# Patient Record
Sex: Male | Born: 1990 | Race: Black or African American | Hispanic: No | Marital: Single | Smoking: Current some day smoker
Health system: Southern US, Community
[De-identification: ages and names within clinical notes are randomized; demographics above are authoritative.]

## PROBLEM LIST (undated history)

## (undated) DIAGNOSIS — K219 Gastro-esophageal reflux disease without esophagitis: Secondary | ICD-10-CM

## (undated) DIAGNOSIS — Z833 Family history of diabetes mellitus: Secondary | ICD-10-CM

## (undated) DIAGNOSIS — F419 Anxiety disorder, unspecified: Secondary | ICD-10-CM

## (undated) DIAGNOSIS — E669 Obesity, unspecified: Secondary | ICD-10-CM

## (undated) DIAGNOSIS — F32A Depression, unspecified: Secondary | ICD-10-CM

## (undated) HISTORY — DX: Obesity, unspecified: E66.9

## (undated) HISTORY — DX: Anxiety disorder, unspecified: F41.9

## (undated) HISTORY — DX: Family history of diabetes mellitus: Z83.3

## (undated) HISTORY — DX: Depression, unspecified: F32.A

## (undated) HISTORY — DX: Gastro-esophageal reflux disease without esophagitis: K21.9

## (undated) HISTORY — PX: WISDOM TOOTH EXTRACTION: SHX21

---
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
## 2012-01-08 NOTE — ED Provider Notes (Signed)
Medical screening examination/treatment/procedure(s) were performed by non-physician practitioner and as supervising physician I was immediately available for consultation/collaboration.  Jamaia Brum M Taima Rada, MD 01/08/12 1636 

## 2012-01-19 NOTE — ED Provider Notes (Signed)
Medical screening examination/treatment/procedure(s) were performed by non-physician practitioner and as supervising physician I was immediately available for consultation/collaboration.  Hurman Horn, MD 01/19/12 [phone]

## 2013-09-28 IMAGING — CT CT HEAD W/O CM
1 series · 16 of 30 positions shown, 20 images · non-contrast
Comparison: None.

CLINICAL DATA: Arm numbness, headache and dizziness.

CT HEAD WITHOUT CONTRAST
TECHNIQUE: Contiguous axial images were obtained from the base of
the skull through the vertex without contrast

[Series 2: head routine 4.8 h37s · axial · 0.43mm/px · z∈[-46,+86]mm · 16 of 30 slices shown, 20 images]
[im 2/30  brain]
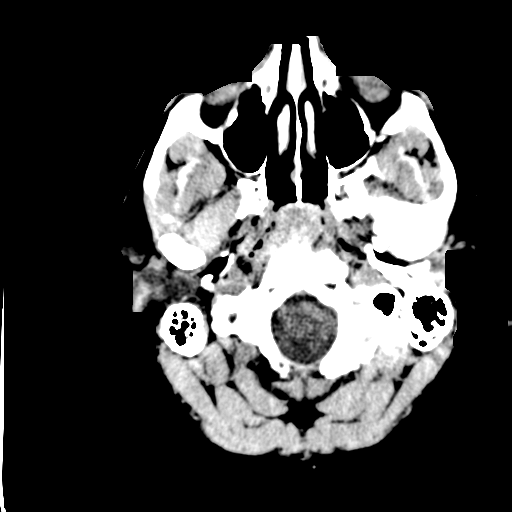
[im 2/30  bone]
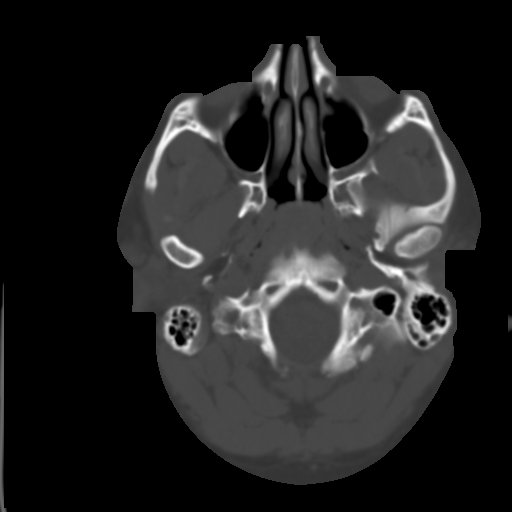
[im 4/30  brain]
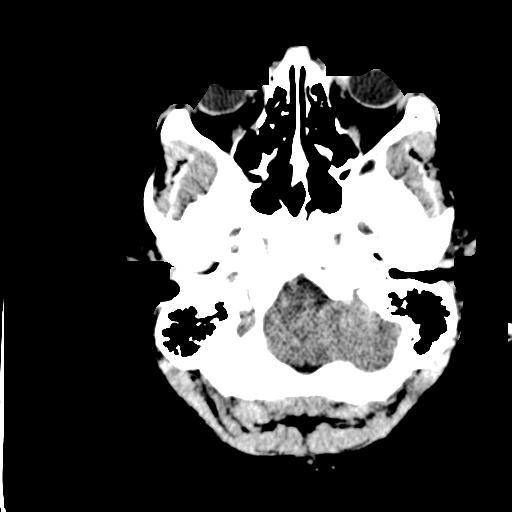
[im 6/30  brain]
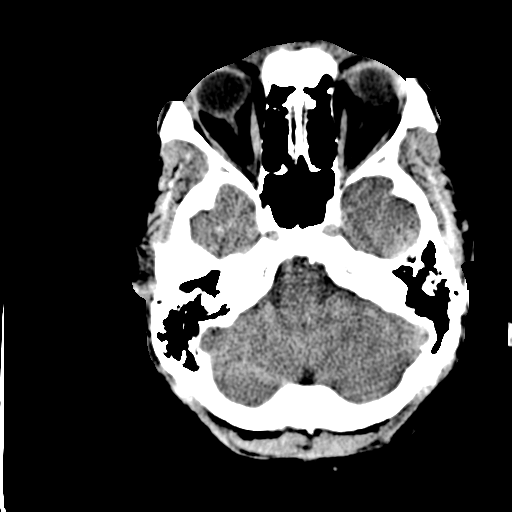
[im 8/30  brain]
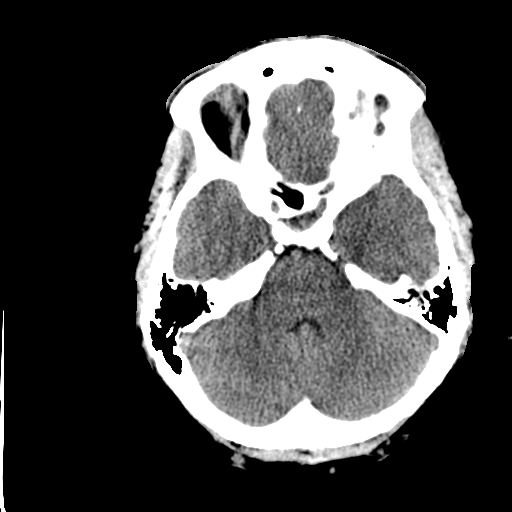
[im 9/30  brain]
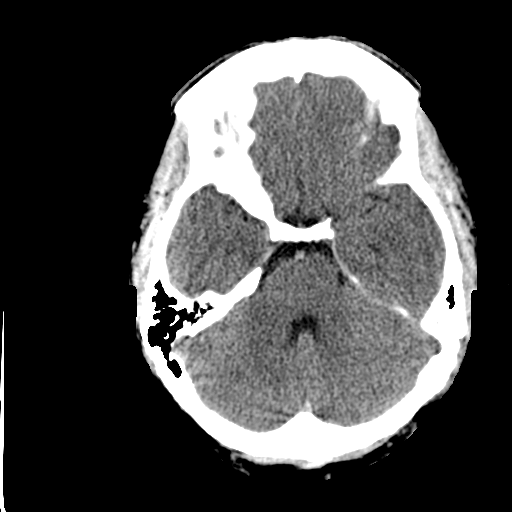
[im 9/30  bone]
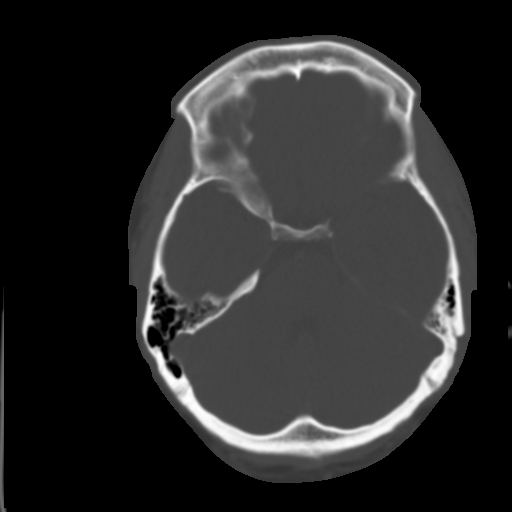
[im 11/30  brain]
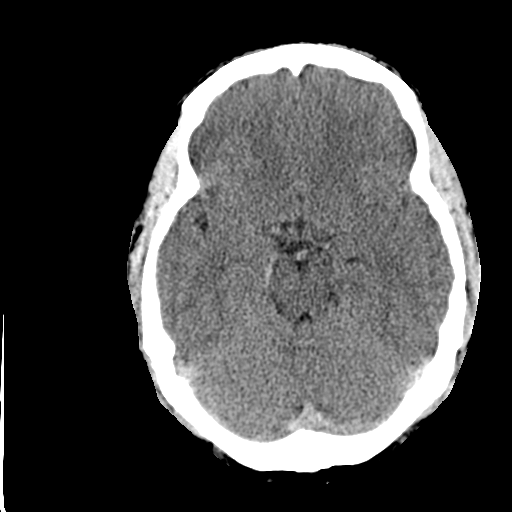
[im 13/30  brain]
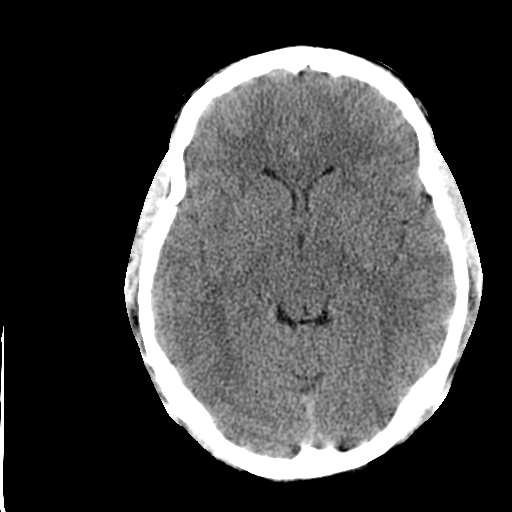
[im 15/30  brain]
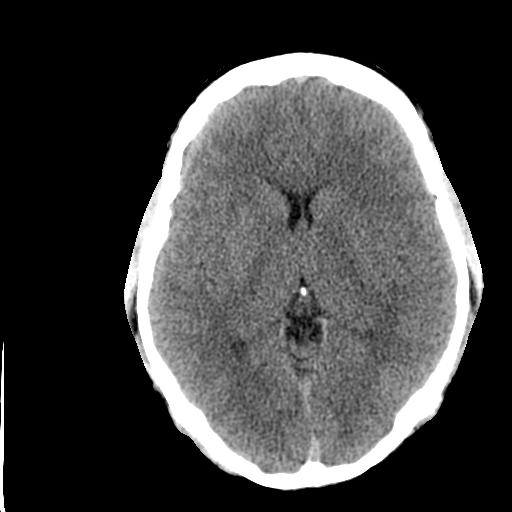
[im 16/30  brain]
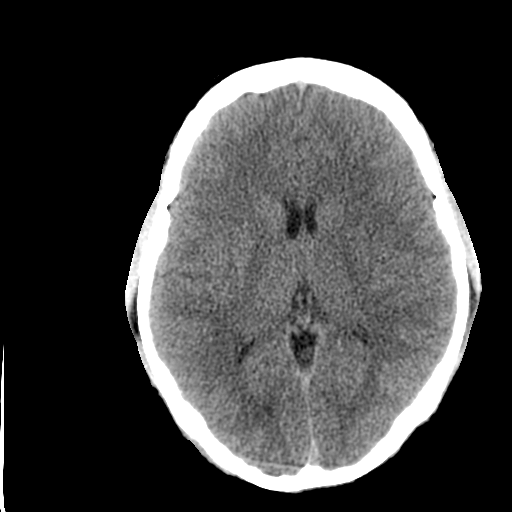
[im 16/30  bone]
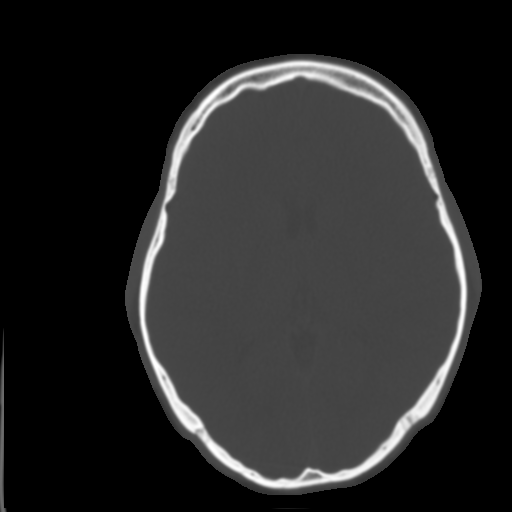
[im 18/30  brain]
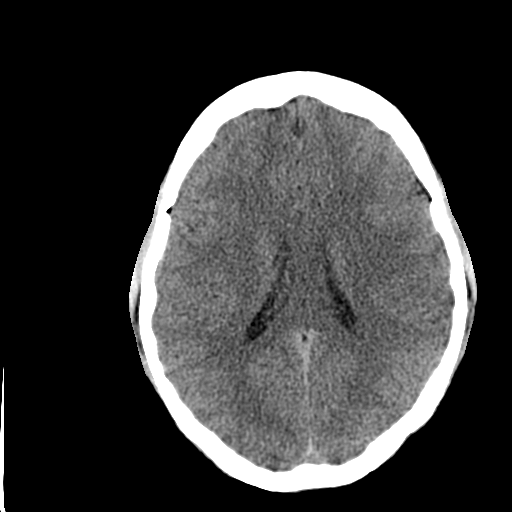
[im 20/30  brain]
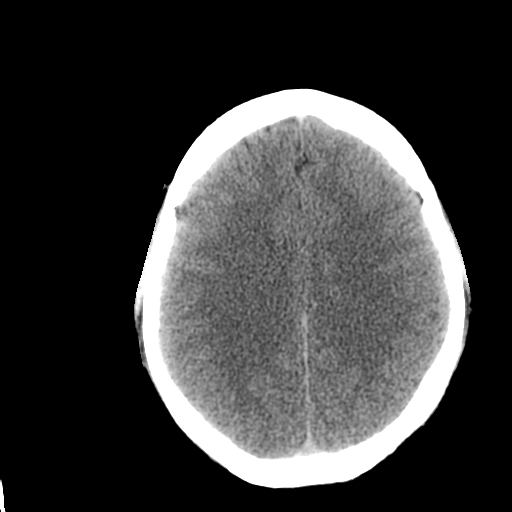
[im 22/30  brain]
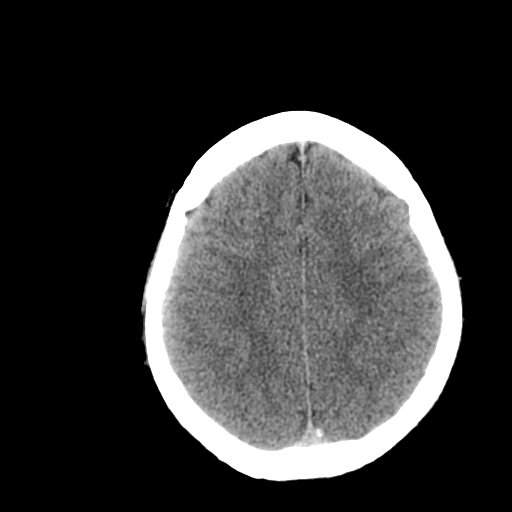
[im 23/30  brain]
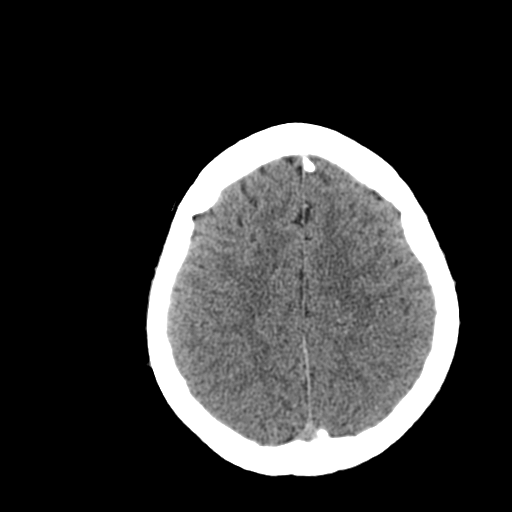
[im 23/30  bone]
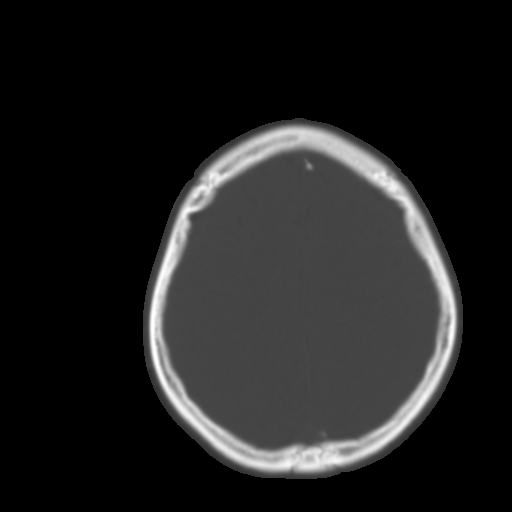
[im 25/30  brain]
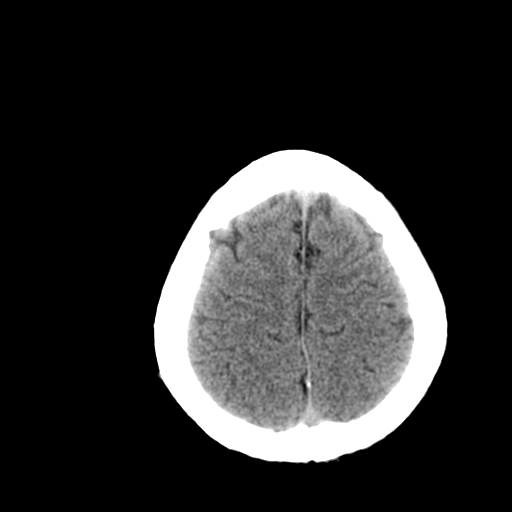
[im 27/30  brain]
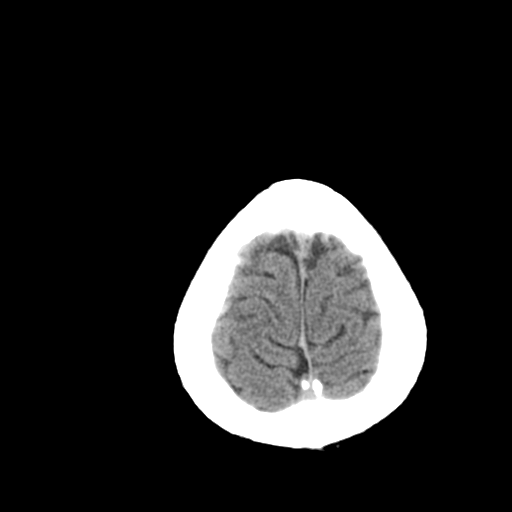
[im 29/30  brain]
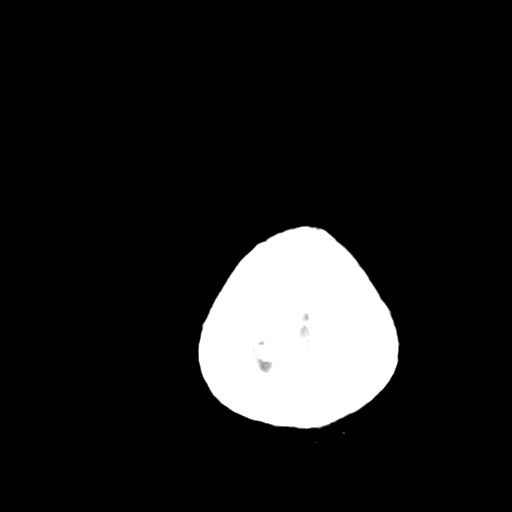

[16 of 30 positions shown; findings below may reference images not displayed]

FINDINGS: The brain has a normal appearance without evidence for
hemorrhage, acute infarction, hydrocephalus, or mass lesion.  There
is no extra axial fluid collection.  The skull and paranasal
sinuses are normal.
IMPRESSION: Normal CT of the head without contrast.

## 2015-05-21 ENCOUNTER — Ambulatory Visit: Payer: Self-pay | Attending: Internal Medicine

## 2015-07-17 ENCOUNTER — Encounter: Payer: Self-pay | Admitting: Family Medicine

## 2015-07-17 ENCOUNTER — Ambulatory Visit (INDEPENDENT_AMBULATORY_CARE_PROVIDER_SITE_OTHER): Payer: No Typology Code available for payment source | Admitting: Family Medicine

## 2015-07-17 DIAGNOSIS — Z6841 Body Mass Index (BMI) 40.0 and over, adult: Secondary | ICD-10-CM | POA: Insufficient documentation

## 2015-07-17 LAB — POCT URINALYSIS DIP (DEVICE)
Bilirubin Urine: NEGATIVE
Glucose, UA: NEGATIVE mg/dL
Hgb urine dipstick: NEGATIVE
Ketones, ur: NEGATIVE mg/dL
Leukocytes, UA: NEGATIVE
Nitrite: NEGATIVE
Protein, ur: NEGATIVE mg/dL
Specific Gravity, Urine: 1.025 (ref 1.005–1.030)
Urobilinogen, UA: 1 mg/dL (ref 0.0–1.0)
pH: 5.5 (ref 5.0–8.0)

## 2015-07-17 LAB — TSH: TSH: 1.39 m[IU]/L (ref 0.40–4.50)

## 2015-07-17 NOTE — Progress Notes (Signed)
Subjective:    Patient ID: Rishav Rockefeller, male    DOB: 09-14-1990, 25 y.o.   MRN: [id number]  HPI Mr. Obie Kallenbach, a 25 year old male presents to establish care. He states that he has not had a primary provider in over the past several years. Patient states that he feels well and has minimal complaints. He is complaining about weight. He states that he does not follow a balanced diet and does not exercise. He maintains that he has a family history of type 2 diabetes mellitus.  Patient denies foot ulcerations, nausea, paresthesia of the feet, polydipsia, polyuria and visual disturbances.   Past Medical History  Diagnosis Date  . Family history of diabetes mellitus in first degree relative   . Obesity      There is no immunization history on file for this patient.  History reviewed. No pertinent past surgical history.  Social History   Social History  . Marital Status: Single    Spouse Name: N/A  . Number of Children: N/A  . Years of Education: N/A   Occupational History  . Not on file.   Social History Main Topics  . Smoking status: Never Smoker   . Smokeless tobacco: Never Used  . Alcohol Use: No  . Drug Use: No  . Sexual Activity: Not on file   Other Topics Concern  . Not on file   Social History Narrative   Review of Systems  Constitutional: Negative.   HENT: Negative.   Eyes: Negative.   Respiratory: Negative.   Cardiovascular: Negative.   Gastrointestinal: Negative.   Endocrine: Negative.  Negative for polydipsia, polyphagia and polyuria.  Genitourinary: Negative.   Musculoskeletal: Negative.   Skin: Negative.   Allergic/Immunologic: Negative.   Neurological: Negative.   Hematological: Negative.   Psychiatric/Behavioral: Negative.       Objective:   Physical Exam  Constitutional: He is oriented to person, place, and time. He appears well-developed and well-nourished.  HENT:  Head: Normocephalic and atraumatic.  Right Ear: External ear normal.  Left  Ear: External ear normal.  Nose: Nose normal.  Eyes: Conjunctivae and EOM are normal. Pupils are equal, round, and reactive to light.  Neck: Normal range of motion. Neck supple.  Cardiovascular: Normal rate, regular rhythm, normal heart sounds and intact distal pulses.   Pulmonary/Chest: Effort normal and breath sounds normal.  Abdominal: Soft. Bowel sounds are normal.  Musculoskeletal: Normal range of motion.  Neurological: He is alert and oriented to person, place, and time. He has normal reflexes.  Skin: Skin is warm and dry.  Psychiatric: He has a normal mood and affect. His behavior is normal. Judgment and thought content normal.     BP 129/60 mmHg  Pulse 89  Temp(Src) 98.7 F (37.1 C) (Oral)  Resp 16  Ht 6' (1.829 m)  Wt 310 lb (140.615 kg)  BMI 42.03 kg/m2  SpO2 100% Assessment & Plan:  1. Morbid obesity, unspecified obesity type (HCC) BMI is 42, I suspect metabolic syndrome. Will screen for metabolic syndrome.  - Lipid Panel - Hemoglobin A1c - COMPLETE METABOLIC PANEL WITH GFR - TSH  2. BMI 40.0-44.9, adult (HCC) Recommend a lowfat, low carbohydrate diet divided over 5-6 small meals, increase water intake to 6-8 glasses, and 150 minutes per week of cardiovascular exercise.    Routine Health Maintenance:  Refused vaccinations Recommend yearly testicular exam and monthly self testicular examination Recommend monthly dental visit Routine eye examination   RTC: 9 months for obesity and routine  health maintenance  Massie MaroonHollis,Tomma Ehinger M, FNP

## 2015-07-17 NOTE — Patient Instructions (Signed)
BMI for Adults Body mass index (BMI) is a number that is calculated from a person's weight and height. In most adults, the number is used to find how much of an adult's weight is made up of fat. BMI is not as accurate as a direct measure of body fat. HOW IS BMI CALCULATED? BMI is calculated by dividing weight in kilograms by height in meters squared. It can also be calculated by dividing weight in pounds by height in inches squared, then multiplying the resulting number by 703. Charts are available to help you find your BMI quickly and easily without doing this calculation.  HOW IS BMI INTERPRETED? Health care professionals use BMI charts to identify whether an adult is underweight, at a normal weight, or overweight based on the following guidelines:  Underweight: BMI less than 18.5.  Normal weight: BMI between 18.5 and 24.9.  Overweight: BMI between 25 and 29.9.  Obese: BMI of 30 and above. BMI is usually interpreted the same for males and females. Weight includes both fat and muscle, so someone with a muscular build, such as an athlete, may have a BMI that is higher than 24.9. In cases like these, BMI may not accurately depict body fat. To determine if excess body fat is the cause of a BMI of 25 or higher, further assessments may need to be done by a health care provider. WHY IS BMI A USEFUL TOOL? BMI is used to identify a possible weight problem that may be related to a medical problem or may increase the risk for medical problems. BMI can also be used to promote changes to reach a healthy weight.   This information is not intended to replace advice given to you by your health care provider. Make sure you discuss any questions you have with your health care provider.   Document Released: 12/25/2003 Document Revised: 05/05/2014 Document Reviewed: 09/09/2013 Elsevier Interactive Patient Education 2016 [employer]. Exercising to [employer] Exercising can help you to lose weight. In order  to lose weight through exercise, you need to do vigorous-intensity exercise. You can tell that you are exercising with vigorous intensity if you are breathing very hard and fast and cannot hold a conversation while exercising. Moderate-intensity exercise helps to maintain your current weight. You can tell that you are exercising at a moderate level if you have a higher heart rate and faster breathing, but you are still able to hold a conversation. HOW OFTEN SHOULD I EXERCISE? Choose an activity that you enjoy and set realistic goals. Your health care provider can help you to make an activity plan that works for you. Exercise regularly as directed by your health care provider. This may include:  Doing resistance training twice each week, such as:  Push-ups.  Sit-ups.  Lifting weights.  Using resistance bands.  Doing a given intensity of exercise for a given amount of time. Choose from these options:  150 minutes of moderate-intensity exercise every week.  75 minutes of vigorous-intensity exercise every week.  A mix of moderate-intensity and vigorous-intensity exercise every week. Children, pregnant women, people who are out of shape, people who are overweight, and older adults may need to consult a health care provider for individual recommendations. If you have any sort of medical condition, be sure to consult your health care provider before starting a new exercise program. WHAT ARE SOME ACTIVITIES THAT CAN HELP ME TO LOSE WEIGHT?   Walking at a rate of at least 4.5 miles an hour.  Jogging or  running at a rate of 5 miles per hour.  Biking at a rate of at least 10 miles per hour.  Lap swimming.  Roller-skating or in-line skating.  Cross-country skiing.  Vigorous competitive sports, such as football, basketball, and soccer.  Jumping rope.  Aerobic dancing. HOW CAN I BE MORE ACTIVE IN MY DAY-TO-DAY ACTIVITIES?  Use the stairs instead of the elevator.  Take a walk during  your lunch break.  If you drive, park your car farther away from work or school.  If you take public transportation, get off one stop early and walk the rest of the way.  Make all of your phone calls while standing up and walking around.  Get up, stretch, and walk around every 30 minutes throughout the day. WHAT GUIDELINES SHOULD I FOLLOW WHILE EXERCISING?  Do not exercise so much that you hurt yourself, feel dizzy, or get very short of breath.  Consult your health care provider prior to starting a new exercise program.  Wear comfortable clothes and shoes with good support.  Drink plenty of water while you exercise to prevent dehydration or heat stroke. Body water is lost during exercise and must be replaced.  Work out until you breathe faster and your heart beats faster.   This information is not intended to replace advice given to you by your health care provider. Make sure you discuss any questions you have with your health care provider.   Document Released: 05/17/2010 Document Revised: 05/05/2014 Document Reviewed: 09/15/2013 Elsevier Interactive Patient Education 2016 [employer]. Obesity Obesity is defined as having too much total body fat and a body mass index (BMI) of 30 or more. BMI is an estimate of body fat and is calculated from your height and weight. BMI is typically calculated by your health care provider during regular wellness visits. Obesity happens when you consume more calories than you can burn by exercising or performing daily physical tasks. Prolonged obesity can cause major illnesses or emergencies, such as:  Stroke.  Heart disease.  Diabetes.  Cancer.  Arthritis.  High blood pressure (hypertension).  High cholesterol.  Sleep apnea.  Erectile dysfunction.  Infertility problems. CAUSES   Regularly eating unhealthy foods.  Physical inactivity.  Certain disorders, such as an underactive thyroid (hypothyroidism), Cushing's syndrome, and  polycystic ovarian syndrome.  Certain medicines, such as steroids, some depression medicines, and antipsychotics.  Genetics.  Lack of sleep. DIAGNOSIS A health care provider can diagnose obesity after calculating your BMI. Obesity will be diagnosed if your BMI is 30 or higher. There are other methods of measuring obesity levels. Some other methods include measuring your skinfold thickness, your waist circumference, and comparing your hip circumference to your waist circumference. TREATMENT  A healthy treatment program includes some or all of the following:  Long-term dietary changes.  Exercise and physical activity.  Behavioral and lifestyle changes.  Medicine only under the supervision of your health care provider. Medicines may help, but only if they are used with diet and exercise programs. If your BMI is 40 or higher, your health care provider may recommend specialized surgery or programs to help with weight loss. An unhealthy treatment program includes:  Fasting.  Fad diets.  Supplements and drugs. These choices do not succeed in long-term weight control. HOME CARE INSTRUCTIONS  Exercise and perform physical activity as directed by your health care provider. To increase physical activity, try the following:  Use stairs instead of elevators.  Park farther away from store entrances.  Garden, bike, or  walk instead of watching television or using the computer.  Eat healthy, low-calorie foods and drinks on a regular basis. Eat more fruits and vegetables. Use low-calorie cookbooks or take healthy cooking classes.  Limit fast food, sweets, and processed snack foods.  Eat smaller portions.  Keep a daily journal of everything you eat. There are many free websites to help you with this. It may be helpful to measure your foods so you can determine if you are eating the correct portion sizes.  Avoid drinking alcohol. Drink more water and drinks without calories.  Take  vitamins and supplements only as recommended by your health care provider.  Weight-loss support groups, Government social research officer, counselors, and stress reduction education can also be very helpful. SEEK IMMEDIATE MEDICAL CARE IF:  You have chest pain or tightness.  You have trouble breathing or feel short of breath.  You have weakness or leg numbness.  You feel confused or have trouble talking.  You have sudden changes in your vision.   This information is not intended to replace advice given to you by your health care provider. Make sure you discuss any questions you have with your health care provider.   Document Released: 05/22/2004 Document Revised: 05/05/2014 Document Reviewed: 05/21/2011 Elsevier Interactive Patient Education 2016 Elsevier Inc. Diet for Metabolic Syndrome Metabolic syndrome is a disorder that includes at least three of these conditions:  Abdominal obesity.  Too much sugar in your blood.  High blood pressure.  Higher than normal amount of fat (lipids) in your blood.  Lower than normal level of "good" cholesterol (HDL). Following a healthy diet can help to keep metabolic syndrome under control. It can also help to prevent the development of conditions that are associated with metabolic syndrome, such as diabetes, heart disease, and stroke. Along with exercise, a healthy diet:  Helps to improve the way that the body uses insulin.  Promotes weight loss. A common goal for people with this condition is to lose at least 7 to 10 percent of their starting weight. WHAT DO I NEED TO KNOW ABOUT THIS DIET?  Use the glycemic index (GI) to plan your meals. The index tells you how quickly a food will raise your blood sugar. Choose foods that have low GI values. These foods take a longer time to raise blood sugar.  Keep track of how many calories you take in. Eating the right amount of calories will help your achieve a healthy weight.  You may want to follow a  Mediterranean diet. This diet includes lots of vegetables, lean meats or fish, whole grains, fruits, and healthy oils and fats. WHAT FOODS CAN I EAT? Grains Stone-ground whole wheat. Pumpernickel bread. Whole-grain bread, crackers, tortillas, cereal, and pasta. Unsweetened oatmeal.Bulgur.Barley.Quinoa.Brown rice or wild rice. Vegetables Lettuce. Spinach. Peas. Beets. Cauliflower. Cabbage. Broccoli. Carrots. Tomatoes. Squash. Eggplant. Herbs. Peppers. Onions. Cucumbers. Brussels sprouts. Sweet potatoes. Yams. Beans. Lentils. Fruits Berries. Apples. Oranges. Grapes. Mango. Pomegranate. Kiwi. Cherries. Meats and Other Protein Sources Seafood and shellfish. Lean meats.Poultry. Tofu. Dairy Low-fat or fat-free dairy products, such as milk, yogurt, and cheese. Beverages Water. Low-fat milk. Milk alternatives, like soy milk or almond milk. Real fruit juice. Condiments Low-sugar or sugar-free ketchup, barbecue sauce, and mayonnaise. Mustard. Relish. Fats and Oils Avocado. Canola or olive oil. Nuts and nut butters.Seeds. The items listed above may not be a complete list of recommended foods or beverages. Contact your dietitian for more options.  WHAT FOODS ARE NOT RECOMMENDED? Red meat. Palm oil and coconut oil. Processed  foods. [place] foods. Alcohol. Sweetened drinks, such as iced tea and soda. Sweets. Salty foods. The items listed above may not be a complete list of foods and beverages to avoid. Contact your dietitian for more information.   This information is not intended to replace advice given to you by your health care provider. Make sure you discuss any questions you have with your health care provider.   Document Released: 08/29/2014 Document Reviewed: 08/29/2014 Elsevier Interactive Patient Education [employer]2016 Elsevier Inc.

## 2015-07-18 ENCOUNTER — Telehealth: Payer: Self-pay

## 2015-07-18 LAB — COMPLETE METABOLIC PANEL WITH GFR
ALBUMIN: 3.9 g/dL (ref 3.6–5.1)
ALK PHOS: 56 U/L (ref 40–115)
ALT: 12 U/L (ref 9–46)
AST: 16 U/L (ref 10–40)
BILIRUBIN TOTAL: 0.6 mg/dL (ref 0.2–1.2)
BUN: 14 mg/dL (ref 7–25)
CALCIUM: 9 mg/dL (ref 8.6–10.3)
CO2: 25 mmol/L (ref 20–31)
CREATININE: 0.84 mg/dL (ref 0.60–1.35)
Chloride: 97 mmol/L — ABNORMAL LOW (ref 98–110)
GFR, Est African American: >89 mL/min (ref >=60)
GFR, Est Non African American: >89 mL/min (ref >=60)
Glucose, Bld: 105 mg/dL — ABNORMAL HIGH (ref 65–99)
POTASSIUM: 4.3 mmol/L (ref 3.5–5.3)
Sodium: 137 mmol/L (ref 135–146)
Total Protein: 7.3 g/dL (ref 6.1–8.1)

## 2015-07-18 LAB — LIPID PANEL
Cholesterol: 195 mg/dL (ref 125–200)
HDL: 49 mg/dL (ref >=40)
LDL CALC: 128 mg/dL (ref <130)
TRIGLYCERIDES: 91 mg/dL (ref <150)
Total CHOL/HDL Ratio: 4 Ratio (ref <=5.0)
VLDL: 18 mg/dL (ref <30)

## 2015-07-18 LAB — HEMOGLOBIN A1C
Hgb A1c MFr Bld: 5.9 % — ABNORMAL HIGH (ref <5.7)
Mean Plasma Glucose: 123 mg/dL — ABNORMAL HIGH (ref <117)

## 2015-07-18 NOTE — Telephone Encounter (Signed)
Tried to call patient, number is not working. I will mail letter with information. Thanks!

## 2015-07-18 NOTE — Telephone Encounter (Signed)
-----   Message from Massie MaroonLachina M Hollis, [place] sent at 07/18/2015  7:54 AM EDT ----- Regarding: lab results Please inform patient that hemoglobin a1C is 5.9, which is consistent with prediabetes. Recommend a lowfat, low carbohydrate diet divided over 5-6 small meals, increase water intake to 6-8 glasses, and 150 minutes per week of cardiovascular exercise. Will follow up as scheduled for a re-check of hemoglobin a1C. All other labs are within a normal range.   Thanks   ----- Message -----    From: [facility]    Sent: 07/18/2015   1:58 AM      To: Massie MaroonLachina M Hollis, FNP

## 2016-04-14 ENCOUNTER — Ambulatory Visit: Payer: No Typology Code available for payment source | Attending: Internal Medicine

## 2016-04-17 ENCOUNTER — Ambulatory Visit: Payer: No Typology Code available for payment source | Admitting: Family Medicine

## 2016-05-05 ENCOUNTER — Ambulatory Visit: Payer: No Typology Code available for payment source | Attending: Internal Medicine

## 2016-12-10 ENCOUNTER — Ambulatory Visit: Payer: No Typology Code available for payment source | Attending: Internal Medicine

## 2017-04-06 ENCOUNTER — Ambulatory Visit: Payer: Self-pay

## 2017-05-06 ENCOUNTER — Ambulatory Visit: Payer: Self-pay | Attending: Internal Medicine

## 2019-09-27 ENCOUNTER — Other Ambulatory Visit: Payer: Self-pay

## 2019-09-27 ENCOUNTER — Encounter: Payer: Self-pay | Admitting: Family Medicine

## 2019-09-27 ENCOUNTER — Ambulatory Visit (INDEPENDENT_AMBULATORY_CARE_PROVIDER_SITE_OTHER): Payer: 59 | Admitting: Family Medicine

## 2019-09-27 VITALS — BP 116/60 | HR 72 | Temp 98.4°F | Ht 72.0 in | Wt 366.0 lb

## 2019-09-27 DIAGNOSIS — F3289 Other specified depressive episodes: Secondary | ICD-10-CM | POA: Diagnosis not present

## 2019-09-27 DIAGNOSIS — F411 Generalized anxiety disorder: Secondary | ICD-10-CM | POA: Diagnosis not present

## 2019-09-27 DIAGNOSIS — E889 Metabolic disorder, unspecified: Secondary | ICD-10-CM

## 2019-09-27 DIAGNOSIS — Z23 Encounter for immunization: Secondary | ICD-10-CM

## 2019-09-27 LAB — POCT URINALYSIS DIPSTICK
Bilirubin, UA: NEGATIVE
Blood, UA: NEGATIVE
Glucose, UA: NEGATIVE
Ketones, UA: NEGATIVE
Leukocytes, UA: NEGATIVE
Nitrite, UA: NEGATIVE
Protein, UA: POSITIVE — AB
Spec Grav, UA: >=1.03 — AB (ref 1.010–1.025)
Urobilinogen, UA: 0.2 E.U./dL
pH, UA: 6 (ref 5.0–8.0)

## 2019-09-27 LAB — POCT GLYCOSYLATED HEMOGLOBIN (HGB A1C)
HbA1c POC (<> result, manual entry): 5.9 % (ref 4.0–5.6)
HbA1c, POC (controlled diabetic range): 5.9 % (ref 0.0–7.0)
HbA1c, POC (prediabetic range): 5.9 % (ref 5.7–6.4)
Hemoglobin A1C: 5.9 % — AB (ref 4.0–5.6)

## 2019-09-27 MED ORDER — SERTRALINE HCL 25 MG PO TABS: 25.0000 mg | ORAL_TABLET | Freq: Every day | ORAL | 1 refills | Status: DC

## 2019-09-27 NOTE — Patient Instructions (Signed)
Sertraline tablets What is this medicine? SERTRALINE (SER tra leen) is used to treat depression. It may also be used to treat obsessive compulsive disorder, panic disorder, post-trauma stress, premenstrual dysphoric disorder (PMDD) or social anxiety. This medicine may be used for other purposes; ask your health care provider or pharmacist if you have questions. COMMON BRAND NAME(S): Zoloft What should I tell my health care provider before I take this medicine? They need to know if you have any of these conditions:  bleeding disorders  bipolar disorder or a family history of bipolar disorder  glaucoma  heart disease  high blood pressure  history of irregular heartbeat  history of low levels of calcium, magnesium, or potassium in the blood  if you often drink alcohol  liver disease  receiving electroconvulsive therapy  seizures  suicidal thoughts, plans, or attempt; a previous suicide attempt by you or a family member  take medicines that treat or prevent blood clots  thyroid disease  an unusual or allergic reaction to sertraline, other medicines, foods, dyes, or preservatives  pregnant or trying to get pregnant  breast-feeding How should I use this medicine? Take this medicine by mouth with a glass of water. Follow the directions on the prescription label. You can take it with or without food. Take your medicine at regular intervals. Do not take your medicine more often than directed. Do not stop taking this medicine suddenly except upon the advice of your doctor. Stopping this medicine too quickly may cause serious side effects or your condition may worsen. A special MedGuide will be given to you by the pharmacist with each prescription and refill. Be sure to read this information carefully each time. Talk to your pediatrician regarding the use of this medicine in children. While this drug may be prescribed for children as young as 7 years for selected conditions,  precautions do apply. Overdosage: If you think you have taken too much of this medicine contact a poison control center or emergency room at once. NOTE: This medicine is only for you. Do not share this medicine with others. What if I miss a dose? If you miss a dose, take it as soon as you can. If it is almost time for your next dose, take only that dose. Do not take double or extra doses. What may interact with this medicine? Do not take this medicine with any of the following medications:  cisapride  dronedarone  linezolid  MAOIs like Carbex, Eldepryl, Marplan, Nardil, and Parnate  methylene blue (injected into a vein)  pimozide  thioridazine This medicine may also interact with the following medications:  alcohol  amphetamines  aspirin and aspirin-like medicines  certain medicines for depression, anxiety, or psychotic disturbances  certain medicines for fungal infections like ketoconazole, fluconazole, posaconazole, and itraconazole  certain medicines for irregular heart beat like flecainide, quinidine, propafenone  certain medicines for migraine headaches like almotriptan, eletriptan, frovatriptan, naratriptan, rizatriptan, sumatriptan, zolmitriptan  certain medicines for sleep  certain medicines for seizures like carbamazepine, valproic acid, phenytoin  certain medicines that treat or prevent blood clots like warfarin, enoxaparin, dalteparin  cimetidine  digoxin  diuretics  fentanyl  isoniazid  lithium  NSAIDs, medicines for pain and inflammation, like ibuprofen or naproxen  other medicines that prolong the QT interval (cause an abnormal heart rhythm) like dofetilide  rasagiline  safinamide  supplements like St. John's wort, kava kava, valerian  tolbutamide  tramadol  tryptophan This list may not describe all possible interactions. Give your health care provider   a list of all the medicines, herbs, non-prescription drugs, or dietary supplements  you use. Also tell them if you smoke, drink alcohol, or use illegal drugs. Some items may interact with your medicine. What should I watch for while using this medicine? Tell your doctor if your symptoms do not get better or if they get worse. Visit your doctor or health care professional for regular checks on your progress. Because it may take several weeks to see the full effects of this medicine, it is important to continue your treatment as prescribed by your doctor. Patients and their families should watch out for new or worsening thoughts of suicide or depression. Also watch out for sudden changes in feelings such as feeling anxious, agitated, panicky, irritable, hostile, aggressive, impulsive, severely restless, overly excited and hyperactive, or not being able to sleep. If this happens, especially at the beginning of treatment or after a change in dose, call your health care professional. You may get drowsy or dizzy. Do not drive, use machinery, or do anything that needs mental alertness until you know how this medicine affects you. Do not stand or sit up quickly, especially if you are an older patient. This reduces the risk of dizzy or fainting spells. Alcohol may interfere with the effect of this medicine. Avoid alcoholic drinks. Your mouth may get dry. Chewing sugarless gum or sucking hard candy, and drinking plenty of water may help. Contact your doctor if the problem does not go away or is severe. What side effects may I notice from receiving this medicine? Side effects that you should report to your doctor or health care professional as soon as possible:  allergic reactions like skin rash, itching or hives, swelling of the face, lips, or tongue  anxious  black, tarry stools  changes in vision  confusion  elevated mood, decreased need for sleep, racing thoughts, impulsive behavior  eye pain  fast, irregular heartbeat  feeling faint or lightheaded, falls  feeling agitated,  angry, or irritable  hallucination, loss of contact with reality  loss of balance or coordination  loss of memory  painful or prolonged erections  restlessness, pacing, inability to keep still  seizures  stiff muscles  suicidal thoughts or other mood changes  trouble sleeping  unusual bleeding or bruising  unusually weak or tired  vomiting Side effects that usually do not require medical attention (report to your doctor or health care professional if they continue or are bothersome):  change in appetite or weight  change in sex drive or performance  diarrhea  increased sweating  indigestion, nausea  tremors This list may not describe all possible side effects. Call your doctor for medical advice about side effects. You may report side effects to FDA at 1-800-FDA-1088. Where should I keep my medicine? Keep out of the reach of children. Store at room temperature between 15 and 30 degrees C (59 and 86 degrees F). Throw away any unused medicine after the expiration date. NOTE: This sheet is a summary. It may not cover all possible information. If you have questions about this medicine, talk to your doctor, pharmacist, or health care provider.  2020 Elsevier/Gold Standard (2018-04-06 10:09:27)  

## 2019-09-27 NOTE — Progress Notes (Signed)
Patient [facility]   New Patient Office Visit  Subjective:  Patient ID: Antonio Hall, male    DOB: [birth year]  Age: 29 y.o. MRN: [id number]  CC:  Chief Complaint  Patient presents with  . Annual Exam    discuss referral to neurology and to mental health     HPI Gasper Hopes is a 29 year old male with a medical history sinificant for morbid obesity presents to reestablish care with a primary care provider. Patient says that he has been experiencing some anxiety and depression over the past several months. Patient has been dealing with the death of his mother. Also, he says that he has been experiencing some financial constraints.  Depression        This is a new problem.  The onset quality is gradual.   The problem occurs daily.  The problem has been gradually worsening since onset.  Associated symptoms include decreased concentration, fatigue, helplessness, hopelessness, restlessness and decreased interest.  Associated symptoms include does not have insomnia, no appetite change, no body aches and no suicidal ideas.     The symptoms are aggravated by family issues and social issues (Patient endorses social anxiety).  Past medical history includes anxiety.   Anxiety Presents for initial visit. Onset was 6 to 12 months ago. The problem has been gradually worsening. Symptoms include decreased concentration, nervous/anxious behavior and restlessness. Patient reports no insomnia or suicidal ideas.     Past Medical History:  Diagnosis Date  . Family history of diabetes mellitus in first degree relative   . Obesity     No past surgical history on file.  Family History  Problem Relation Age of Onset  . Hypertension Mother   . Diabetes Paternal Grandmother     Social History   Socioeconomic History  . Marital status: Single    Spouse name: Not on file  . Number of children: Not on file  . Years of education: Not on file  . Highest  education level: Not on file  Occupational History  . Not on file  Tobacco Use  . Smoking status: Never Smoker  . Smokeless tobacco: Never Used  Substance and Sexual Activity  . Alcohol use: No  . Drug use: No  . Sexual activity: Not on file  Other Topics Concern  . Not on file  Social History Narrative  . Not on file   Social Determinants of Health   Financial Resource Strain:   . Difficulty of Paying Living Expenses:   Food Insecurity:   . Worried About Charity fundraiser in the Last Year:   . Arboriculturist in the Last Year:   Transportation Needs:   . Film/video editor (Medical):   Marland Kitchen Lack of Transportation (Non-Medical):   Physical Activity:   . Days of Exercise per Week:   . Minutes of Exercise per Session:   Stress:   . Feeling of Stress :   Social Connections:   . Frequency of Communication with Friends and Family:   . Frequency of Social Gatherings with Friends and Family:   . Attends Religious Services:   . Active Member of Clubs or Organizations:   . Attends Archivist Meetings:   Marland Kitchen Marital Status:   Intimate Partner Violence:   . Fear of Current or Ex-Partner:   . Emotionally Abused:   Marland Kitchen Physically Abused:   . Sexually Abused:   Marland Kitchen  ROS Review of Systems  Constitutional: Positive for fatigue. Negative for appetite change.  HENT: Negative.   Eyes: Negative.   Respiratory: Negative.   Cardiovascular: Negative.   Gastrointestinal: Negative.   Endocrine: Negative.   Genitourinary: Negative.   Musculoskeletal: Negative.   Skin: Negative.   Neurological: Negative.   Psychiatric/Behavioral: Positive for decreased concentration and depression. Negative for suicidal ideas. The patient is nervous/anxious. The patient does not have insomnia.     Objective:   Today's Vitals: BP 116/60 (BP Location: Left Arm, Patient Position: Sitting)   Pulse 72   Temp 98.4 F (36.9 C)   Ht 6' (1.829 m)   Wt (!) 366 lb (166 kg)   SpO2 100%   BMI  49.64 kg/m   Physical Exam Constitutional:      Appearance: Normal appearance. He is obese.  HENT:     Mouth/Throat:     Mouth: Mucous membranes are moist.  Cardiovascular:     Rate and Rhythm: Normal rate and regular rhythm.     Pulses: Normal pulses.  Pulmonary:     Effort: Pulmonary effort is normal.  Abdominal:     General: Abdomen is flat. Bowel sounds are normal.  Musculoskeletal:     Cervical back: Normal range of motion.  Skin:    General: Skin is warm.  Neurological:     General: No focal deficit present.     Mental Status: He is alert and oriented to person, place, and time. Mental status is at baseline.  Psychiatric:        Mood and Affect: Mood is anxious.        Behavior: Behavior is cooperative.        Thought Content: Thought content does not include homicidal or suicidal ideation.     Assessment & Plan:   Problem List Items Addressed This Visit    None      Outpatient Encounter Medications as of 09/27/2019  Medication Sig  . cholecalciferol (VITAMIN D3) 25 MCG (1000 UNIT) tablet Take 1,000 Units by mouth daily. Once a day 1 tablet  . Cobalamin Combinations (NEURIVA PLUS PO) Take by mouth. 1 tablet once a day  . vitamin B-12 (CYANOCOBALAMIN) 100 MCG tablet Take 100 mcg by mouth daily. 1 tablet once a day   No facility-administered encounter medications on file as of 09/27/2019.   1. Anxiety, generalized GAD 7 : Generalized Anxiety Score 09/27/2019  Nervous, Anxious, on Edge 3  Control/stop worrying 2  Worry too much - different things 2  Trouble relaxing 0  Restless 0  Easily annoyed or irritable 0  Afraid - awful might happen 0  Total GAD 7 Score 7  Anxiety Difficulty Not difficult at all    Will start a trial of sertraline 25 mg at bedtime.  - Ambulatory referral to Psychiatry - sertraline (ZOLOFT) 25 MG tablet; Take 1 tablet (25 mg total) by mouth at bedtime.  Dispense: 30 tablet; Refill: 1  2. Other depression   Office Visit from 09/27/2019 in  [facility]  PHQ-2 Total Score 4     - Ambulatory referral to Psychiatry - sertraline (ZOLOFT) 25 MG tablet; Take 1 tablet (25 mg total) by mouth at bedtime.  Dispense: 30 tablet; Refill: 1  3. Metabolic disorder The patient is asked to make an attempt to improve diet and exercise patterns to aid in medical management of this problem. - HgB A1c - TSH - Comprehensive metabolic panel - Lipid Panel - POCT Urinalysis Dipstick  4. Need for  Tdap vaccination - Tdap vaccine greater than or equal to 7yo IM  5. Class 2 severe obesity due to excess calories with serious comorbidity in adult, unspecified BMI (HCC) Last Weight  Most recent update: 09/27/2019 10:34 AM   Weight  166 kg (366 lb)             - HgB A1c - Comprehensive metabolic panel Follow-up: Return in about 1 month (around 10/27/2019).   Nolon Nations  APRN, MSN, FNP-C Patient Care [facility] [address] [place], [place] [address] [phone]

## 2019-09-28 LAB — COMPREHENSIVE METABOLIC PANEL
ALT: 14 IU/L (ref 0–44)
AST: 19 IU/L (ref 0–40)
Albumin/Globulin Ratio: 1.4 (ref 1.2–2.2)
Albumin: 4.3 g/dL (ref 4.1–5.2)
Alkaline Phosphatase: 64 IU/L (ref 48–121)
BUN/Creatinine Ratio: 23 — ABNORMAL HIGH (ref 9–20)
BUN: 20 mg/dL (ref 6–20)
Bilirubin Total: <0.2 mg/dL (ref 0.0–1.2)
CO2: 23 mmol/L (ref 20–29)
Calcium: 9.4 mg/dL (ref 8.7–10.2)
Chloride: 100 mmol/L (ref 96–106)
Creatinine, Ser: 0.86 mg/dL (ref 0.76–1.27)
GFR calc Af Amer: 138 mL/min/{1.73_m2} (ref >59)
GFR calc non Af Amer: 120 mL/min/{1.73_m2} (ref >59)
Globulin, Total: 3 g/dL (ref 1.5–4.5)
Glucose: 97 mg/dL (ref 65–99)
Potassium: 4.9 mmol/L (ref 3.5–5.2)
Sodium: 138 mmol/L (ref 134–144)
Total Protein: 7.3 g/dL (ref 6.0–8.5)

## 2019-09-28 LAB — TSH: TSH: 1.96 u[IU]/mL (ref 0.450–4.500)

## 2019-09-28 LAB — LIPID PANEL
Chol/HDL Ratio: 4 ratio (ref 0.0–5.0)
Cholesterol, Total: 204 mg/dL — ABNORMAL HIGH (ref 100–199)
HDL: 51 mg/dL (ref >39)
LDL Chol Calc (NIH): 142 mg/dL — ABNORMAL HIGH (ref 0–99)
Triglycerides: 59 mg/dL (ref 0–149)
VLDL Cholesterol Cal: 11 mg/dL (ref 5–40)

## 2019-11-01 ENCOUNTER — Ambulatory Visit (INDEPENDENT_AMBULATORY_CARE_PROVIDER_SITE_OTHER): Payer: 59 | Admitting: Family Medicine

## 2019-11-01 ENCOUNTER — Encounter: Payer: Self-pay | Admitting: Family Medicine

## 2019-11-01 ENCOUNTER — Other Ambulatory Visit: Payer: Self-pay

## 2019-11-01 DIAGNOSIS — F3289 Other specified depressive episodes: Secondary | ICD-10-CM

## 2019-11-01 DIAGNOSIS — F411 Generalized anxiety disorder: Secondary | ICD-10-CM | POA: Diagnosis not present

## 2019-11-01 MED ORDER — SERTRALINE HCL 50 MG PO TABS: 50.0000 mg | ORAL_TABLET | Freq: Every day | ORAL | 2 refills | Status: DC

## 2019-11-01 NOTE — Progress Notes (Signed)
[facility]    Established Patient Office Visit  Subjective:  Patient ID: Antonio Hall, male    DOB: May 18, 1990  Age: 29 y.o. MRN: [id number]  CC:  Chief Complaint  Patient presents with  . Follow-up    requesting increase dose for Zoloft.    HPI Antonio Hall is a 29 year old male with a medical history significant for generalized anxiety, obesity, and prediabetes that presents for follow-up of anxiety.  Patient is also requesting an increase in Zoloft.  Approximately 1 month ago patient was diagnosed with generalized anxiety disorder.  Patient's GAD-7 and medical history were consistent with ongoing anxiety.  Patient was referred to psychiatry at that time, but has not received an appointment.  Patient was started on a trial of Zoloft 25 mg at bedtime.  He states that medication has been mostly effective throughout the day, but seems to wear off in the evenings prior to taking evening dose.  He continues to endorse some excessive worrying.  He denies any suicidal or homicidal intent.  He also denies any visual or auditory hallucinations.  Patient is concerned that he may have a "problem with his brain", he is requesting "brain x-rays".  Past Medical History:  Diagnosis Date  . Family history of diabetes mellitus in first degree relative   . Obesity     No past surgical history on file.  Family History  Problem Relation Age of Onset  . Hypertension Mother   . Diabetes Paternal Grandmother     Social History   Socioeconomic History  . Marital status: Single    Spouse name: Not on file  . Number of children: Not on file  . Years of education: Not on file  . Highest education level: Not on file  Occupational History  . Not on file  Tobacco Use  . Smoking status: Never Smoker  . Smokeless tobacco: Never Used  Substance and Sexual Activity  . Alcohol use: Yes    Comment: occ  . Drug use: No  . Sexual activity: Not on file    Other Topics Concern  . Not on file  Social History Narrative  . Not on file   Social Determinants of Health   Financial Resource Strain:   . Difficulty of Paying Living Expenses:   Food Insecurity:   . Worried About Programme researcher, broadcasting/film/video in the Last Year:   . Barista in the Last Year:   Transportation Needs:   . Freight forwarder (Medical):   Marland Kitchen Lack of Transportation (Non-Medical):   Physical Activity:   . Days of Exercise per Week:   . Minutes of Exercise per Session:   Stress:   . Feeling of Stress :   Social Connections:   . Frequency of Communication with Friends and Family:   . Frequency of Social Gatherings with Friends and Family:   . Attends Religious Services:   . Active Member of Clubs or Organizations:   . Attends Banker Meetings:   Marland Kitchen Marital Status:   Intimate Partner Violence:   . Fear of Current or Ex-Partner:   . Emotionally Abused:   Marland Kitchen Physically Abused:   . Sexually Abused:     Outpatient Medications Prior to Visit  Medication Sig Dispense Refill  . cholecalciferol (VITAMIN D3) 25 MCG (1000 UNIT) tablet Take 1,000 Units by mouth daily. Once a day 1 tablet    . Cobalamin Combinations (NEURIVA PLUS PO) Take by  mouth. 1 tablet once a day    . vitamin B-12 (CYANOCOBALAMIN) 100 MCG tablet Take 100 mcg by mouth daily. 1 tablet once a day    . sertraline (ZOLOFT) 25 MG tablet Take 1 tablet (25 mg total) by mouth at bedtime. 30 tablet 1   No facility-administered medications prior to visit.    No Known Allergies  ROS Review of Systems  Constitutional: Negative.   HENT: Negative.   Eyes: Negative.   Respiratory: Negative.   Cardiovascular: Negative.   Gastrointestinal: Negative.   Genitourinary: Negative.   Musculoskeletal: Negative for arthralgias and back pain.  Skin: Negative.   Psychiatric/Behavioral: Positive for dysphoric mood.      Objective:    Physical Exam Constitutional:      Appearance: Normal appearance.  He is obese.  Eyes:     Pupils: Pupils are equal, round, and reactive to light.  Cardiovascular:     Rate and Rhythm: Normal rate and regular rhythm.     Pulses: Normal pulses.  Pulmonary:     Effort: Pulmonary effort is normal.     Breath sounds: Normal breath sounds.  Abdominal:     General: Abdomen is flat. Bowel sounds are normal.  Musculoskeletal:        General: Normal range of motion.  Skin:    General: Skin is warm.  Neurological:     General: No focal deficit present.     Mental Status: He is alert. Mental status is at baseline.  Psychiatric:        Mood and Affect: Mood normal.        Behavior: Behavior normal.        Thought Content: Thought content normal.        Judgment: Judgment normal.     Ht 6' (1.829 m)   Wt (!) 357 lb 0.8 oz (162 kg)   SpO2 98%   BMI 48.42 kg/m  Wt Readings from Last 3 Encounters:  11/01/19 (!) 357 lb 0.8 oz (162 kg)  09/27/19 (!) 366 lb (166 kg)  07/17/15 (!) 310 lb (140.6 kg)     Health Maintenance Due  Topic Date Due  . Hepatitis C Screening  Never done  . COVID-19 Vaccine (1) Never done  . HIV Screening  Never done  . TETANUS/TDAP  Never done    There are no preventive care reminders to display for this patient.  Lab Results  Component Value Date   TSH 1.960 09/27/2019   Lab Results  Component Value Date   WBC 5.0 12/09/2011   HGB 14.5 12/09/2011   HCT 43.1 12/09/2011   MCV 76.8 (L) 12/09/2011   PLT 247 12/09/2011   Lab Results  Component Value Date   NA 138 09/27/2019   K 4.9 09/27/2019   CO2 23 09/27/2019   GLUCOSE 97 09/27/2019   BUN 20 09/27/2019   CREATININE 0.86 09/27/2019   BILITOT <0.2 09/27/2019   ALKPHOS 64 09/27/2019   AST 19 09/27/2019   ALT 14 09/27/2019   PROT 7.3 09/27/2019   ALBUMIN 4.3 09/27/2019   CALCIUM 9.4 09/27/2019   Lab Results  Component Value Date   CHOL 204 (H) 09/27/2019   Lab Results  Component Value Date   HDL 51 09/27/2019   Lab Results  Component Value Date    LDLCALC 142 (H) 09/27/2019   Lab Results  Component Value Date   TRIG 59 09/27/2019   Lab Results  Component Value Date   CHOLHDL 4.0 09/27/2019  Lab Results  Component Value Date   HGBA1C 5.9 (A) 09/27/2019   HGBA1C 5.9 09/27/2019   HGBA1C 5.9 09/27/2019   HGBA1C 5.9 09/27/2019      Assessment & Plan:   Problem List Items Addressed This Visit    None    Visit Diagnoses    Anxiety, generalized       Relevant Medications   sertraline (ZOLOFT) 50 MG tablet   Other depression       Relevant Medications   sertraline (ZOLOFT) 50 MG tablet      Meds ordered this encounter  Medications  . sertraline (ZOLOFT) 50 MG tablet    Sig: Take 1 tablet (50 mg total) by mouth at bedtime.    Dispense:  30 tablet    Refill:  2    Order Specific Question:   Supervising Provider    Answer:   Quentin Angst [id number]   Anxiety, generalized We will increase sertraline to 50 mg at bedtime.  Also, will follow-up in 1 month.  We will resend referral to psychiatry.  Patient warrants further work-up and evaluation.  Also, reassured patient that he does not warrant any brain imaging at this time.  Will defer to psychiatry for further work-up and evaluation.  Patient expressed understanding. - sertraline (ZOLOFT) 50 MG tablet; Take 1 tablet (50 mg total) by mouth at bedtime.  Dispense: 30 tablet; Refill: 2 - Ambulatory referral to Psychiatry  Other depression - sertraline (ZOLOFT) 50 MG tablet; Take 1 tablet (50 mg total) by mouth at bedtime.  Dispense: 30 tablet; Refill: 2 - Ambulatory referral to Psychiatry Follow-up: Return in about 1 month (around 12/02/2019).     Nolon Nations  APRN, MSN, FNP-C Patient Care [facility] [address] [place], [place] [address] [phone]

## 2019-11-18 ENCOUNTER — Telehealth: Payer: Self-pay | Admitting: Family Medicine

## 2019-11-18 NOTE — Telephone Encounter (Signed)
Sent to Patient.

## 2019-11-23 ENCOUNTER — Other Ambulatory Visit: Payer: Self-pay | Admitting: Family Medicine

## 2019-11-23 DIAGNOSIS — F3289 Other specified depressive episodes: Secondary | ICD-10-CM

## 2019-11-23 DIAGNOSIS — F411 Generalized anxiety disorder: Secondary | ICD-10-CM

## 2019-12-06 ENCOUNTER — Other Ambulatory Visit: Payer: Self-pay

## 2019-12-06 ENCOUNTER — Other Ambulatory Visit: Payer: Self-pay | Admitting: Family Medicine

## 2019-12-06 ENCOUNTER — Ambulatory Visit (INDEPENDENT_AMBULATORY_CARE_PROVIDER_SITE_OTHER): Payer: 59 | Admitting: Family Medicine

## 2019-12-06 ENCOUNTER — Encounter: Payer: Self-pay | Admitting: Family Medicine

## 2019-12-06 VITALS — BP 112/60 | HR 69 | Temp 97.7°F | Resp 16 | Ht 72.0 in | Wt 362.0 lb

## 2019-12-06 DIAGNOSIS — F3289 Other specified depressive episodes: Secondary | ICD-10-CM | POA: Diagnosis not present

## 2019-12-06 DIAGNOSIS — F411 Generalized anxiety disorder: Secondary | ICD-10-CM

## 2019-12-06 NOTE — Progress Notes (Signed)
Integrated Behavioral Health Behavioral Health Screening    12/06/2019 Name: Antonio Hall MRN: [id number] DOB: 08/23/1990 Antonio Hall is a 29 y.o. year old male who sees Massie Maroon, FNP for primary care. LCSW was consulted to assess mental health needs and assist the patient with Mental Health Counseling and Resources.   Interpreter: No.   Interpreter Name & Language: none  SUBJECTIVE: Presenting issue / symptoms/concerns: anxiety and depression Duration of symptoms/ how impacting: discomfort in social situations at times Recent life changes: none reported Family / Social support:   Mood: Euthymic Affect: Appropriate  OBJECTIVE:  Psychiatric History - Diagnoses: anxiety, depression - Hospitalizations/ prior attempts: not known - Pharmacotherapy: zoloft - Outpatient therapy: none, referred to outpatient psychiatry Family history of psychiatric issues: not known Current and history of substance use: not known  GAD score of 3 is an indication of  mild anxiety.  PHQ-2 score of 1 is an indication of mild depression. Depression screen [facility] 2/9 12/06/2019 09/27/2019 09/27/2019  Decreased Interest 1 2 1   Down, Depressed, Hopeless 0 2 2  PHQ - 2 Score 1 4 3   Altered sleeping - 0 0  Tired, decreased energy - 2 1  Change in appetite - 0 3  Feeling bad or failure about yourself  - 1 2  Trouble concentrating - 0 3  Moving slowly or fidgety/restless - 2 2  Suicidal thoughts - 0 0  PHQ-9 Score - 9 14  Difficult doing work/chores - Not difficult at all -     GAD 7 : Generalized Anxiety Score 11/01/2019 09/27/2019  Nervous, Anxious, on Edge 1 3  Control/stop worrying 1 2  Worry too much - different things 1 2  Trouble relaxing 0 0  Restless 0 0  Easily annoyed or irritable 0 0  Afraid - awful might happen 0 0  Total GAD 7 Score 3 7  Anxiety Difficulty Not difficult at all Not difficult at all    Outpatient Encounter Medications as of 12/06/2019  Medication Sig   sertraline  (ZOLOFT) 50 MG tablet TAKE 1 TABLET BY MOUTH EVERYDAY AT BEDTIME   cholecalciferol (VITAMIN D3) 25 MCG (1000 UNIT) tablet Take 1,000 Units by mouth daily. Once a day 1 tablet (Patient not taking: Reported on 12/06/2019)   Cobalamin Combinations (NEURIVA PLUS PO) Take by mouth. 1 tablet once a day (Patient not taking: Reported on 12/06/2019)   vitamin B-12 (CYANOCOBALAMIN) 100 MCG tablet Take 100 mcg by mouth daily. 1 tablet once a day (Patient not taking: Reported on 12/06/2019)   No facility-administered encounter medications on file as of 12/06/2019.    Review of patient status, including review of consultants reports, relevant laboratory and other test results, and collaboration with appropriate care team members and the patient's provider was performed as part of comprehensive patient evaluation and provision of services.    Assessment: Patient is currently experiencing symptoms of anxiety and depression which are exacerbated by social situations.   Recommendation: Patient may benefit from, and is in agreement to follow up with psychiatry at [facility] behavioral health outpatient.   Intervention:Patient interviewed and appropriate assessments performed: PHQ 2 (completed by CMA)  Patient interviewed and appropriate assessments performed  Provided mental health counseling with regard to anxiety and depression (mental health diagnosis or concern) (psychoeducation)  Mindfulness or Relaxation Training - CSW led mindful breathing and mindful movement exercises. Patient reported improvement in mood and patient is to practice these exercises at home.   SDOH (Social Determinants of Health) assessments performed:  No   Goals Addressed   None     Follow Up Plan:  1. Patient to follow up with referral to [facility] 2. CSW available for support as needed from clinic. Provided patient with contact information.  Abigail Butts, LCSW Patient Care Center [facility] [phone]

## 2019-12-06 NOTE — Progress Notes (Signed)
[facility]   Established Patient Office Visit  Subjective:  Patient ID: Antonio Hall, male    DOB: 1990/05/15  Age: 29 y.o. MRN: [id number]  CC:  Chief Complaint  Patient presents with   Anxiety    1 month follow up     HPI Antonio Hall is a very pleasant 29 year old male with a medical history significant for generalized anxiety and depression. Patient was started on Zoloft several months ago for anxiety.  Zoloft was increased last month to 50 mg daily.  Patient states that symptoms have improved significantly with increased dose.  Patient was previously referred to psychiatry, he has not received an appointment as of yet.  Patient denies any anhedonia, constant worrying, or suicidal or homicidal ideations.  He also denies any visual or auditory hallucinations.  He states that he is feeling great this week, he has been on vacation.  Past Medical History:  Diagnosis Date   Family history of diabetes mellitus in first degree relative    Obesity     History reviewed. No pertinent surgical history.  Family History  Problem Relation Age of Onset   Hypertension Mother    Diabetes Paternal Grandmother     Social History   Socioeconomic History   Marital status: Single    Spouse name: Not on file   Number of children: Not on file   Years of education: Not on file   Highest education level: Not on file  Occupational History   Not on file  Tobacco Use   Smoking status: Never Smoker   Smokeless tobacco: Never Used  Substance and Sexual Activity   Alcohol use: Yes    Comment: occ   Drug use: No   Sexual activity: Not on file  Other Topics Concern   Not on file  Social History Narrative   Not on file   Social Determinants of Health   Financial Resource Strain:    Difficulty of Paying Living Expenses:   Food Insecurity:    Worried About Programme researcher, broadcasting/film/video in the Last Year:    Barista in  the Last Year:   Transportation Needs:    Freight forwarder (Medical):    Lack of Transportation (Non-Medical):   Physical Activity:    Days of Exercise per Week:    Minutes of Exercise per Session:   Stress:    Feeling of Stress :   Social Connections:    Frequency of Communication with Friends and Family:    Frequency of Social Gatherings with Friends and Family:    Attends Religious Services:    Active Member of Clubs or Organizations:    Attends Engineer, structural:    Marital Status:   Intimate Partner Violence:    Fear of Current or Ex-Partner:    Emotionally Abused:    Physically Abused:    Sexually Abused:     Outpatient Medications Prior to Visit  Medication Sig Dispense Refill   sertraline (ZOLOFT) 50 MG tablet TAKE 1 TABLET BY MOUTH EVERYDAY AT BEDTIME 90 tablet 1   cholecalciferol (VITAMIN D3) 25 MCG (1000 UNIT) tablet Take 1,000 Units by mouth daily. Once a day 1 tablet (Patient not taking: Reported on 12/06/2019)     Cobalamin Combinations (NEURIVA PLUS PO) Take by mouth. 1 tablet once a day (Patient not taking: Reported on 12/06/2019)     vitamin B-12 (CYANOCOBALAMIN) 100 MCG tablet Take 100 mcg by  mouth daily. 1 tablet once a day (Patient not taking: Reported on 12/06/2019)     No facility-administered medications prior to visit.    No Known Allergies  ROS Review of Systems  Constitutional: Negative for activity change and appetite change.  HENT: Negative.   Eyes: Negative.   Gastrointestinal: Negative.   Genitourinary: Negative.   Musculoskeletal: Negative.   Allergic/Immunologic: Negative.   Neurological: Negative.   Psychiatric/Behavioral: Negative for agitation, behavioral problems, decreased concentration, dysphoric mood and sleep disturbance. The patient is not nervous/anxious.       Objective:    Physical Exam Constitutional:      Appearance: He is obese.  Eyes:     Pupils: Pupils are equal, round, and  reactive to light.  Cardiovascular:     Rate and Rhythm: Normal rate and regular rhythm.     Pulses: Normal pulses.  Pulmonary:     Effort: Pulmonary effort is normal.  Abdominal:     General: Bowel sounds are normal.     Palpations: Abdomen is soft.  Musculoskeletal:        General: Normal range of motion.  Neurological:     General: No focal deficit present.     Mental Status: He is alert.  Psychiatric:        Mood and Affect: Mood normal.        Behavior: Behavior normal.        Thought Content: Thought content normal.        Judgment: Judgment normal.     BP 112/60 (BP Location: Right Arm, Patient Position: Sitting, Cuff Size: Large)    Pulse 69    Temp 97.7 F (36.5 C) (Oral)    Resp 16    Ht 6' (1.829 m)    Wt (!) 362 lb (164.2 kg)    SpO2 100%    BMI 49.10 kg/m  Wt Readings from Last 3 Encounters:  12/06/19 (!) 362 lb (164.2 kg)  11/01/19 (!) 357 lb 0.8 oz (162 kg)  09/27/19 (!) 366 lb (166 kg)     Health Maintenance Due  Topic Date Due   Hepatitis C Screening  Never done   COVID-19 Vaccine (1) Never done   HIV Screening  Never done   TETANUS/TDAP  Never done    There are no preventive care reminders to display for this patient.  Lab Results  Component Value Date   TSH 1.960 09/27/2019   Lab Results  Component Value Date   WBC 5.0 12/09/2011   HGB 14.5 12/09/2011   HCT 43.1 12/09/2011   MCV 76.8 (L) 12/09/2011   PLT 247 12/09/2011   Lab Results  Component Value Date   NA 138 09/27/2019   K 4.9 09/27/2019   CO2 23 09/27/2019   GLUCOSE 97 09/27/2019   BUN 20 09/27/2019   CREATININE 0.86 09/27/2019   BILITOT <0.2 09/27/2019   ALKPHOS 64 09/27/2019   AST 19 09/27/2019   ALT 14 09/27/2019   PROT 7.3 09/27/2019   ALBUMIN 4.3 09/27/2019   CALCIUM 9.4 09/27/2019   Lab Results  Component Value Date   CHOL 204 (H) 09/27/2019   Lab Results  Component Value Date   HDL 51 09/27/2019   Lab Results  Component Value Date   LDLCALC 142 (H)  09/27/2019   Lab Results  Component Value Date   TRIG 59 09/27/2019   Lab Results  Component Value Date   CHOLHDL 4.0 09/27/2019   Lab Results  Component Value Date  HGBA1C 5.9 (A) 09/27/2019   HGBA1C 5.9 09/27/2019   HGBA1C 5.9 09/27/2019   HGBA1C 5.9 09/27/2019      Assessment & Plan:   Problem List Items Addressed This Visit    None    Visit Diagnoses    Anxiety, generalized    -  Primary   Other depression       Class 2 severe obesity due to excess calories with serious comorbidity in adult, unspecified BMI (HCC)          Anxiety, generalized We will continue Zoloft 50 mg at bedtime.  No medication changes warranted at this time.  Will research previous referral to psychiatry.  LCSW was consulted to assess mental health needs and assist the patient with mental health counseling and resources.  Other depression Refer to above  Class 2 severe obesity due to excess calories with serious comorbidity in adult, unspecified BMI (HCC) The patient is asked to make an attempt to improve diet and exercise patterns to aid in medical management of this problem.    Follow-up: Return in about 3 months (around 03/07/2020) for depression.    Nolon Nations  APRN, MSN, FNP-C [facility] [address] [place], [place] [address] [phone]

## 2020-01-17 ENCOUNTER — Other Ambulatory Visit: Payer: Self-pay | Admitting: Family Medicine

## 2020-01-17 DIAGNOSIS — F3289 Other specified depressive episodes: Secondary | ICD-10-CM

## 2020-01-17 DIAGNOSIS — F411 Generalized anxiety disorder: Secondary | ICD-10-CM

## 2020-01-17 MED ORDER — SERTRALINE HCL 50 MG PO TABS: ORAL_TABLET | ORAL | 1 refills | Status: DC

## 2020-01-17 NOTE — Progress Notes (Signed)
Meds ordered this encounter  Medications  . sertraline (ZOLOFT) 50 MG tablet    Sig: TAKE 1 TABLET BY MOUTH EVERYDAY AT BEDTIME    Dispense:  90 tablet    Refill:  1    Order Specific Question:   Supervising Provider    Answer:   Quentin Angst [[id number]]

## 2020-03-06 ENCOUNTER — Ambulatory Visit (INDEPENDENT_AMBULATORY_CARE_PROVIDER_SITE_OTHER): Payer: 59 | Admitting: Family Medicine

## 2020-03-06 ENCOUNTER — Other Ambulatory Visit: Payer: Self-pay

## 2020-03-06 ENCOUNTER — Encounter: Payer: Self-pay | Admitting: Family Medicine

## 2020-03-06 VITALS — BP 142/99 | Temp 97.7°F | Ht 72.0 in | Wt 378.2 lb

## 2020-03-06 DIAGNOSIS — Z1159 Encounter for screening for other viral diseases: Secondary | ICD-10-CM | POA: Diagnosis not present

## 2020-03-06 DIAGNOSIS — K219 Gastro-esophageal reflux disease without esophagitis: Secondary | ICD-10-CM

## 2020-03-06 DIAGNOSIS — Z114 Encounter for screening for human immunodeficiency virus [HIV]: Secondary | ICD-10-CM | POA: Diagnosis not present

## 2020-03-06 DIAGNOSIS — N528 Other male erectile dysfunction: Secondary | ICD-10-CM | POA: Diagnosis not present

## 2020-03-06 DIAGNOSIS — Z87898 Personal history of other specified conditions: Secondary | ICD-10-CM | POA: Diagnosis not present

## 2020-03-06 MED ORDER — OMEPRAZOLE 20 MG PO CPDR: 20.0000 mg | DELAYED_RELEASE_CAPSULE | Freq: Every day | ORAL | 3 refills | Status: DC

## 2020-03-06 NOTE — Patient Instructions (Signed)

## 2020-03-06 NOTE — Progress Notes (Signed)
[facility]   Established Patient Office Visit  Subjective:  Patient ID: Antonio Hall, male    DOB: 04/05/1991  Age: 29 y.o. MRN: [id number]  CC:  Chief Complaint  Patient presents with  . Anxiety    HPI Antonio Hall is a very pleasant 29 year old male with a medical history significant for morbid obesity, prediabetes, and anxiety presents for follow-up of chronic conditions.  Patient states that he has been doing well and has minimal complaints today. Patient endorses some fatigue.  He is requiring about low testosterone levels.  He does not have a family history.  Patient also does not have a family history of prostate cancer.  He denies any lower urinary tract symptoms.  Denies difficulty starting a urine stream, retention, or urinary urgency.  Patient reports some erectile dysfunction.  He has difficulty obtaining and maintaining erection.  Patient has a history of anxiety.  Anxiety has been well controlled on sertraline.  Patient has been taking medication consistently.  He states that overall worrying has improved.  Also, patient denies any suicidal or homicidal intent.  He has been doing mindfulness exercises that were provided by licensed clinical Child psychotherapist.  Past Medical History:  Diagnosis Date  . Family history of diabetes mellitus in first degree relative   . Obesity     No past surgical history on file.  Family History  Problem Relation Age of Onset  . Hypertension Mother   . Diabetes Paternal Grandmother     Social History   Socioeconomic History  . Marital status: Single    Spouse name: Not on file  . Number of children: Not on file  . Years of education: Not on file  . Highest education level: Not on file  Occupational History  . Not on file  Tobacco Use  . Smoking status: Never Smoker  . Smokeless tobacco: Never Used  Substance and Sexual Activity  . Alcohol use: Yes    Comment: occ  . Drug use:  No  . Sexual activity: Not on file  Other Topics Concern  . Not on file  Social History Narrative  . Not on file   Social Determinants of Health   Financial Resource Strain:   . Difficulty of Paying Living Expenses: Not on file  Food Insecurity:   . Worried About Programme researcher, broadcasting/film/video in the Last Year: Not on file  . Ran Out of Food in the Last Year: Not on file  Transportation Needs:   . Lack of Transportation (Medical): Not on file  . Lack of Transportation (Non-Medical): Not on file  Physical Activity:   . Days of Exercise per Week: Not on file  . Minutes of Exercise per Session: Not on file  Stress:   . Feeling of Stress : Not on file  Social Connections:   . Frequency of Communication with Friends and Family: Not on file  . Frequency of Social Gatherings with Friends and Family: Not on file  . Attends Religious Services: Not on file  . Active Member of Clubs or Organizations: Not on file  . Attends Banker Meetings: Not on file  . Marital Status: Not on file  Intimate Partner Violence:   . Fear of Current or Ex-Partner: Not on file  . Emotionally Abused: Not on file  . Physically Abused: Not on file  . Sexually Abused: Not on file    Outpatient Medications Prior to Visit  Medication Sig Dispense  Refill  . cholecalciferol (VITAMIN D3) 25 MCG (1000 UNIT) tablet Take 1,000 Units by mouth daily. Once a day 1 tablet (Patient not taking: Reported on 12/06/2019)    . Cobalamin Combinations (NEURIVA PLUS PO) Take by mouth. 1 tablet once a day (Patient not taking: Reported on 12/06/2019)    . sertraline (ZOLOFT) 50 MG tablet TAKE 1 TABLET BY MOUTH EVERYDAY AT BEDTIME 90 tablet 1  . vitamin B-12 (CYANOCOBALAMIN) 100 MCG tablet Take 100 mcg by mouth daily. 1 tablet once a day (Patient not taking: Reported on 12/06/2019)     No facility-administered medications prior to visit.    No Known Allergies  ROS Review of Systems  Constitutional: Negative.   HENT: Negative.    Eyes: Negative.   Respiratory: Negative.   Cardiovascular: Negative.   Gastrointestinal: Negative.   Endocrine: Negative.  Negative for polydipsia, polyphagia and polyuria.  Genitourinary: Negative.  Negative for decreased urine volume, frequency and urgency.  Musculoskeletal: Negative.   Skin: Negative.   Neurological: Negative.   Psychiatric/Behavioral: Negative.       Objective:    Physical Exam Constitutional:      Appearance: He is obese.  Eyes:     Pupils: Pupils are equal, round, and reactive to light.  Cardiovascular:     Rate and Rhythm: Normal rate and regular rhythm.     Pulses: Normal pulses.  Pulmonary:     Effort: Pulmonary effort is normal.     Breath sounds: Normal breath sounds.  Abdominal:     General: Abdomen is flat. Bowel sounds are normal.  Neurological:     General: No focal deficit present.     Mental Status: Mental status is at baseline.  Psychiatric:        Mood and Affect: Mood normal.        Behavior: Behavior normal.        Thought Content: Thought content normal.        Judgment: Judgment normal.     There were no vitals taken for this visit. Wt Readings from Last 3 Encounters:  12/06/19 (!) 362 lb (164.2 kg)  11/01/19 (!) 357 lb 0.8 oz (162 kg)  09/27/19 (!) 366 lb (166 kg)     Health Maintenance Due  Topic Date Due  . Hepatitis C Screening  Never done  . COVID-19 Vaccine (1) Never done  . HIV Screening  Never done  . TETANUS/TDAP  Never done    There are no preventive care reminders to display for this patient.  Lab Results  Component Value Date   TSH 1.960 09/27/2019   Lab Results  Component Value Date   WBC 5.0 12/09/2011   HGB 14.5 12/09/2011   HCT 43.1 12/09/2011   MCV 76.8 (L) 12/09/2011   PLT 247 12/09/2011   Lab Results  Component Value Date   NA 138 09/27/2019   K 4.9 09/27/2019   CO2 23 09/27/2019   GLUCOSE 97 09/27/2019   BUN 20 09/27/2019   CREATININE 0.86 09/27/2019   BILITOT <0.2 09/27/2019    ALKPHOS 64 09/27/2019   AST 19 09/27/2019   ALT 14 09/27/2019   PROT 7.3 09/27/2019   ALBUMIN 4.3 09/27/2019   CALCIUM 9.4 09/27/2019   Lab Results  Component Value Date   CHOL 204 (H) 09/27/2019   Lab Results  Component Value Date   HDL 51 09/27/2019   Lab Results  Component Value Date   LDLCALC 142 (H) 09/27/2019   Lab Results  Component  Value Date   TRIG 59 09/27/2019   Lab Results  Component Value Date   CHOLHDL 4.0 09/27/2019   Lab Results  Component Value Date   HGBA1C 5.9 (A) 09/27/2019   HGBA1C 5.9 09/27/2019   HGBA1C 5.9 09/27/2019   HGBA1C 5.9 09/27/2019      Assessment & Plan:   Problem List Items Addressed This Visit    None    Visit Diagnoses    Screening for HIV without presence of risk factors    -  Primary   Relevant Orders   HIV antibody (with reflex) (Completed)   Need for hepatitis C screening test       Relevant Orders   Hepatitis C Antibody (Completed)   History of prediabetes       Relevant Orders   Hemoglobin A1c (Completed)   Other male erectile dysfunction       Relevant Orders   Testosterone (Completed)   PSA (Completed)   Urinalysis, Routine w reflex microscopic (Completed)   Gastroesophageal reflux disease without esophagitis       Relevant Medications   omeprazole (PRILOSEC) 20 MG capsule    1. Screening for HIV without presence of risk factors - HIV antibody (with reflex)  2. Need for hepatitis C screening test - Hepatitis C Antibody  3. History of prediabetes - Hemoglobin A1c  4. Other male erectile dysfunction - Testosterone - PSA - Urinalysis, Routine w reflex microscopic  5. Gastroesophageal reflux disease without esophagitis - omeprazole (PRILOSEC) 20 MG capsule; Take 1 capsule (20 mg total) by mouth daily.  Dispense: 30 capsule; Refill: 3  Follow-up: Return in about 3 months (around 06/06/2020).    Nolon Nations  APRN, MSN, FNP-C Patient Care [facility] [address]  [place], [place] [address] [phone]

## 2020-03-07 LAB — URINALYSIS, ROUTINE W REFLEX MICROSCOPIC
Bilirubin, UA: NEGATIVE
Glucose, UA: NEGATIVE
Ketones, UA: NEGATIVE
Leukocytes,UA: NEGATIVE
Nitrite, UA: NEGATIVE
Protein,UA: NEGATIVE
RBC, UA: NEGATIVE
Specific Gravity, UA: 1.024 (ref 1.005–1.030)
Urobilinogen, Ur: 0.2 mg/dL (ref 0.2–1.0)
pH, UA: 6.5 (ref 5.0–7.5)

## 2020-03-07 LAB — HIV ANTIBODY (ROUTINE TESTING W REFLEX): HIV Screen 4th Generation wRfx: NONREACTIVE

## 2020-03-07 LAB — HEPATITIS C ANTIBODY: Hep C Virus Ab: <0.1 s/co ratio (ref 0.0–0.9)

## 2020-03-07 LAB — PSA: Prostate Specific Ag, Serum: 0.6 ng/mL (ref 0.0–4.0)

## 2020-03-07 LAB — TESTOSTERONE: Testosterone: 371 ng/dL (ref 264–916)

## 2020-03-07 LAB — HEMOGLOBIN A1C
Est. average glucose Bld gHb Est-mCnc: 137 mg/dL
Hgb A1c MFr Bld: 6.4 % — ABNORMAL HIGH (ref 4.8–5.6)

## 2020-03-09 ENCOUNTER — Other Ambulatory Visit (HOSPITAL_COMMUNITY): Payer: Self-pay | Admitting: Family Medicine

## 2020-03-09 DIAGNOSIS — R7303 Prediabetes: Secondary | ICD-10-CM

## 2020-03-09 MED ORDER — METFORMIN HCL 500 MG PO TABS: 500.0000 mg | ORAL_TABLET | Freq: Every day | ORAL | 1 refills | Status: DC

## 2020-03-09 NOTE — Progress Notes (Signed)
Meds ordered this encounter  Medications  . metFORMIN (GLUCOPHAGE) 500 MG tablet    Sig: Take 1 tablet (500 mg total) by mouth daily with breakfast.    Dispense:  90 tablet    Refill:  1    Order Specific Question:   Supervising Provider    Answer:   Quentin Angst [[id number]]    Nolon Nations  APRN, MSN, FNP-C [facility] [address] [place], [place] [address] [phone]

## 2020-05-28 ENCOUNTER — Other Ambulatory Visit: Payer: Self-pay | Admitting: Family Medicine

## 2020-05-28 DIAGNOSIS — F411 Generalized anxiety disorder: Secondary | ICD-10-CM

## 2020-05-28 DIAGNOSIS — F3289 Other specified depressive episodes: Secondary | ICD-10-CM

## 2020-05-28 NOTE — Telephone Encounter (Signed)
Is this okay to refill? 

## 2020-06-05 ENCOUNTER — Ambulatory Visit (INDEPENDENT_AMBULATORY_CARE_PROVIDER_SITE_OTHER): Payer: 59 | Admitting: Family Medicine

## 2020-06-05 ENCOUNTER — Other Ambulatory Visit: Payer: Self-pay

## 2020-06-05 ENCOUNTER — Encounter: Payer: Self-pay | Admitting: Family Medicine

## 2020-06-05 VITALS — BP 129/67 | HR 81 | Ht 72.0 in | Wt 382.0 lb

## 2020-06-05 DIAGNOSIS — Z87898 Personal history of other specified conditions: Secondary | ICD-10-CM | POA: Diagnosis not present

## 2020-06-05 DIAGNOSIS — F411 Generalized anxiety disorder: Secondary | ICD-10-CM | POA: Diagnosis not present

## 2020-06-05 DIAGNOSIS — E1169 Type 2 diabetes mellitus with other specified complication: Secondary | ICD-10-CM | POA: Insufficient documentation

## 2020-06-05 DIAGNOSIS — Z6841 Body Mass Index (BMI) 40.0 and over, adult: Secondary | ICD-10-CM

## 2020-06-05 DIAGNOSIS — E7800 Pure hypercholesterolemia, unspecified: Secondary | ICD-10-CM | POA: Diagnosis not present

## 2020-06-05 NOTE — Progress Notes (Signed)
[facility]    Established Patient Office Visit  Subjective:  Patient ID: Antonio Hall, male    DOB: 24-Sep-1990  Age: 30 y.o. MRN: [id number]  CC:  Chief Complaint  Patient presents with  . Follow-up    HPI Antonio Hall is a is a very pleasant 30 year old male with a medical history significant for generalized anxiety, morbid obesity, and type 2 diabetes that presents for follow-up of chronic conditions.  At previous appointment, hemoglobin A1c was found to be 6.4 which was moderately increased from previous.  Patient was not following a low carbohydrate, low-fat diet divided over small meals at advised.  He states that he has been eating out more and has not been exercising.  Patient was started on Metformin 500 mg twice daily.  He denies any headache, dizziness, blurred vision, chest pain, urinary symptoms, nausea, vomiting, or diarrhea. Patient does not check blood glucose at home. Patient has a history of generalized anxiety disorder.  Symptoms have improved on Zoloft.  His continues to endorse excessive worry from time to time.  He denies any suicidal or homicidal ideations.  He has been taking medication consistently and without interruption. Past Medical History:  Diagnosis Date  . Family history of diabetes mellitus in first degree relative   . Obesity     History reviewed. No pertinent surgical history.  Family History  Problem Relation Age of Onset  . Hypertension Mother   . Diabetes Paternal Grandmother     Social History   Socioeconomic History  . Marital status: Single    Spouse name: Not on file  . Number of children: Not on file  . Years of education: Not on file  . Highest education level: Not on file  Occupational History  . Not on file  Tobacco Use  . Smoking status: Never Smoker  . Smokeless tobacco: Never Used  Substance and Sexual Activity  . Alcohol use: Yes    Comment: occ  . Drug use: No  .  Sexual activity: Not on file  Other Topics Concern  . Not on file  Social History Narrative  . Not on file   Social Determinants of Health   Financial Resource Strain: Not on file  Food Insecurity: Not on file  Transportation Needs: Not on file  Physical Activity: Not on file  Stress: Not on file  Social Connections: Not on file  Intimate Partner Violence: Not on file    Outpatient Medications Prior to Visit  Medication Sig Dispense Refill  . metFORMIN (GLUCOPHAGE) 500 MG tablet Take 1 tablet (500 mg total) by mouth daily with breakfast. 90 tablet 1  . omeprazole (PRILOSEC) 20 MG capsule Take 1 capsule (20 mg total) by mouth daily. 30 capsule 3  . sertraline (ZOLOFT) 50 MG tablet TAKE 1 TABLET BY MOUTH EVERYDAY AT BEDTIME 90 tablet 1  . Cobalamin Combinations (NEURIVA PLUS PO) Take by mouth. 1 tablet once a day (Patient not taking: Reported on 12/06/2019)    . vitamin B-12 (CYANOCOBALAMIN) 100 MCG tablet Take 100 mcg by mouth daily. 1 tablet once a day (Patient not taking: Reported on 06/05/2020)    . cholecalciferol (VITAMIN D3) 25 MCG (1000 UNIT) tablet Take 1,000 Units by mouth daily. Once a day 1 tablet (Patient not taking: Reported on 12/06/2019)     No facility-administered medications prior to visit.    No Known Allergies  ROS Review of Systems  Constitutional: Negative for fatigue and fever.  HENT: Negative.   Eyes: Negative.   Respiratory: Negative.   Cardiovascular: Negative.   Gastrointestinal: Negative.   Endocrine: Negative for polydipsia, polyphagia and polyuria.  Genitourinary: Negative.   Musculoskeletal: Negative.   Skin: Negative.   Neurological: Negative.   Psychiatric/Behavioral: Negative for agitation, behavioral problems, dysphoric mood, sleep disturbance and suicidal ideas.      Objective:    Physical Exam Constitutional:      Appearance: Normal appearance. He is obese.  Eyes:     Pupils: Pupils are equal, round, and reactive to light.   Cardiovascular:     Rate and Rhythm: Normal rate and regular rhythm.     Pulses: Normal pulses.  Pulmonary:     Effort: Pulmonary effort is normal.  Abdominal:     General: Abdomen is flat. Bowel sounds are normal.  Neurological:     General: No focal deficit present.     Mental Status: He is alert. Mental status is at baseline.  Psychiatric:        Mood and Affect: Mood normal.        Behavior: Behavior normal.        Thought Content: Thought content normal.        Judgment: Judgment normal.     BP 129/67   Pulse 81   Ht 6' (1.829 m)   Wt (!) 382 lb (173.3 kg)   SpO2 97%   BMI 51.81 kg/m  Wt Readings from Last 3 Encounters:  06/05/20 (!) 382 lb (173.3 kg)  03/06/20 (!) 378 lb 3.2 oz (171.6 kg)  12/06/19 (!) 362 lb (164.2 kg)     Health Maintenance Due  Topic Date Due  . COVID-19 Vaccine (3 - Booster for Pfizer series) 01/15/2020    There are no preventive care reminders to display for this patient.  Lab Results  Component Value Date   TSH 1.960 09/27/2019   Lab Results  Component Value Date   WBC 5.0 12/09/2011   HGB 14.5 12/09/2011   HCT 43.1 12/09/2011   MCV 76.8 (L) 12/09/2011   PLT 247 12/09/2011   Lab Results  Component Value Date   NA 138 09/27/2019   K 4.9 09/27/2019   CO2 23 09/27/2019   GLUCOSE 97 09/27/2019   BUN 20 09/27/2019   CREATININE 0.86 09/27/2019   BILITOT <0.2 09/27/2019   ALKPHOS 64 09/27/2019   AST 19 09/27/2019   ALT 14 09/27/2019   PROT 7.3 09/27/2019   ALBUMIN 4.3 09/27/2019   CALCIUM 9.4 09/27/2019   Lab Results  Component Value Date   CHOL 204 (H) 09/27/2019   Lab Results  Component Value Date   HDL 51 09/27/2019   Lab Results  Component Value Date   LDLCALC 142 (H) 09/27/2019   Lab Results  Component Value Date   TRIG 59 09/27/2019   Lab Results  Component Value Date   CHOLHDL 4.0 09/27/2019   Lab Results  Component Value Date   HGBA1C 6.4 (H) 03/06/2020      Assessment & Plan:   Problem List  Items Addressed This Visit   None   Visit Diagnoses    History of prediabetes    -  Primary   Relevant Orders   POCT glycosylated hemoglobin (Hb A1C)   Urinalysis Dipstick   Basic Metabolic Panel   Hypercholesteremia       Relevant Orders   Lipid Panel      1. History of prediabetes - POCT glycosylated hemoglobin (Hb A1C) - Urinalysis  Dipstick - Basic Metabolic Panel  2. Hypercholesteremia Review results as they become available.  Low-fat, low carbohydrate diet divided over small meals throughout the day recommended. - Lipid Panel  3. BMI 40.0-44.9, adult [facility]) The patient is asked to make an attempt to improve diet and exercise patterns to aid in medical management of this problem.   4. Anxiety, generalized Continue Zoloft 50 mg daily. Patient declines referral to behavioral specialist at this time.   Follow-up: Follow-up in 3 months for chronic conditions  Antonio Hall Rennis Petty  APRN, MSN, FNP-C Patient Care [facility] [address] [place], [place] [address] [phone]

## 2020-06-05 NOTE — Patient Instructions (Signed)
Recommend a routine eye examination Recommend refraining from using door-.  Recommend low-carb, low-fat diet divided over 5-6 small meals throughout the day Increase water intake to around 64 ounces per day Increase physical activity, low impact cardiovascular exercise 3-5 times per day. Continue medications as prescribed. We will follow-up by phone with laboratory results

## 2020-06-06 LAB — BASIC METABOLIC PANEL
BUN/Creatinine Ratio: 17 (ref 9–20)
BUN: 14 mg/dL (ref 6–20)
CO2: 24 mmol/L (ref 20–29)
Calcium: 9.7 mg/dL (ref 8.7–10.2)
Chloride: 98 mmol/L (ref 96–106)
Creatinine, Ser: 0.84 mg/dL (ref 0.76–1.27)
GFR calc Af Amer: 139 mL/min/{1.73_m2} (ref >59)
GFR calc non Af Amer: 120 mL/min/{1.73_m2} (ref >59)
Glucose: 111 mg/dL — ABNORMAL HIGH (ref 65–99)
Potassium: 4.5 mmol/L (ref 3.5–5.2)
Sodium: 139 mmol/L (ref 134–144)

## 2020-06-06 LAB — LIPID PANEL
Chol/HDL Ratio: 4.3 ratio (ref 0.0–5.0)
Cholesterol, Total: 214 mg/dL — ABNORMAL HIGH (ref 100–199)
HDL: 50 mg/dL (ref >39)
LDL Chol Calc (NIH): 148 mg/dL — ABNORMAL HIGH (ref 0–99)
Triglycerides: 88 mg/dL (ref 0–149)
VLDL Cholesterol Cal: 16 mg/dL (ref 5–40)

## 2020-06-07 ENCOUNTER — Telehealth: Payer: Self-pay

## 2020-06-07 NOTE — Telephone Encounter (Signed)
-----   Message from Massie Maroon, [place] sent at 06/06/2020  4:51 PM EST ----- Regarding: lab results Please inform patient that total cholesterol is elevated at 214, goal is to be less than 200.  Also, LDL, "bad cholesterol" is 244, goal is to be less than 100.  Patient and I discussed starting a low-fat, low carbohydrate diet divided over small meals throughout the day.  Also, at 30 minutes of low impact cardiovascular exercise 3 times per week to start.  Goal is to lose 10% of current weight by follow-up appointment.  We will discuss further during follow-up.  Nolon Nations  APRN, MSN, FNP-C Patient Care [facility] [address] [place], [place] [address] [phone]

## 2020-06-07 NOTE — Telephone Encounter (Signed)
Called and lvm for patient to return call

## 2020-06-14 NOTE — Telephone Encounter (Signed)
Called and lvm for patient to return call regarding lab results

## 2020-06-15 ENCOUNTER — Other Ambulatory Visit: Payer: Self-pay | Admitting: Family Medicine

## 2020-06-15 DIAGNOSIS — R7303 Prediabetes: Secondary | ICD-10-CM

## 2020-09-04 ENCOUNTER — Ambulatory Visit: Payer: 59 | Admitting: Family Medicine

## 2020-10-09 ENCOUNTER — Ambulatory Visit: Payer: 59 | Admitting: Family Medicine

## 2021-01-04 ENCOUNTER — Other Ambulatory Visit: Payer: Self-pay | Admitting: Family Medicine

## 2021-01-04 DIAGNOSIS — F3289 Other specified depressive episodes: Secondary | ICD-10-CM

## 2021-01-04 DIAGNOSIS — F411 Generalized anxiety disorder: Secondary | ICD-10-CM

## 2021-01-18 ENCOUNTER — Other Ambulatory Visit: Payer: Self-pay | Admitting: Family Medicine

## 2021-01-18 DIAGNOSIS — F3289 Other specified depressive episodes: Secondary | ICD-10-CM

## 2021-01-18 DIAGNOSIS — F411 Generalized anxiety disorder: Secondary | ICD-10-CM

## 2021-02-16 ENCOUNTER — Other Ambulatory Visit: Payer: Self-pay | Admitting: Family Medicine

## 2021-02-16 DIAGNOSIS — F3289 Other specified depressive episodes: Secondary | ICD-10-CM

## 2021-02-16 DIAGNOSIS — F411 Generalized anxiety disorder: Secondary | ICD-10-CM

## 2022-02-12 ENCOUNTER — Other Ambulatory Visit: Payer: Self-pay | Admitting: Family Medicine

## 2022-02-12 DIAGNOSIS — F3289 Other specified depressive episodes: Secondary | ICD-10-CM

## 2022-02-12 DIAGNOSIS — F411 Generalized anxiety disorder: Secondary | ICD-10-CM

## 2022-02-26 ENCOUNTER — Other Ambulatory Visit: Payer: Self-pay | Admitting: Family Medicine

## 2022-02-26 DIAGNOSIS — F411 Generalized anxiety disorder: Secondary | ICD-10-CM

## 2022-02-26 DIAGNOSIS — F3289 Other specified depressive episodes: Secondary | ICD-10-CM

## 2022-04-04 ENCOUNTER — Other Ambulatory Visit: Payer: Self-pay | Admitting: Family Medicine

## 2022-04-04 DIAGNOSIS — F3289 Other specified depressive episodes: Secondary | ICD-10-CM

## 2022-04-04 DIAGNOSIS — F411 Generalized anxiety disorder: Secondary | ICD-10-CM

## 2022-04-04 NOTE — Telephone Encounter (Signed)
[facility] is  to fill pt zoloft. Please advise [facility]

## 2022-07-05 ENCOUNTER — Other Ambulatory Visit: Payer: Self-pay | Admitting: Family Medicine

## 2022-07-05 DIAGNOSIS — F411 Generalized anxiety disorder: Secondary | ICD-10-CM

## 2022-07-05 DIAGNOSIS — F3289 Other specified depressive episodes: Secondary | ICD-10-CM

## 2022-07-29 ENCOUNTER — Other Ambulatory Visit: Payer: Self-pay | Admitting: Nurse Practitioner

## 2022-07-29 DIAGNOSIS — F3289 Other specified depressive episodes: Secondary | ICD-10-CM

## 2022-07-29 DIAGNOSIS — F411 Generalized anxiety disorder: Secondary | ICD-10-CM

## 2022-07-29 NOTE — Telephone Encounter (Signed)
Please advise [facility] 

## 2022-09-30 ENCOUNTER — Other Ambulatory Visit: Payer: Self-pay | Admitting: Nurse Practitioner

## 2022-09-30 DIAGNOSIS — F3289 Other specified depressive episodes: Secondary | ICD-10-CM

## 2022-09-30 DIAGNOSIS — F411 Generalized anxiety disorder: Secondary | ICD-10-CM

## 2022-09-30 NOTE — Telephone Encounter (Signed)
Please advise [facility] 

## 2022-12-04 ENCOUNTER — Other Ambulatory Visit: Payer: Self-pay | Admitting: Family Medicine

## 2022-12-04 DIAGNOSIS — F411 Generalized anxiety disorder: Secondary | ICD-10-CM

## 2022-12-04 DIAGNOSIS — F3289 Other specified depressive episodes: Secondary | ICD-10-CM

## 2022-12-08 ENCOUNTER — Other Ambulatory Visit: Payer: Self-pay

## 2022-12-08 DIAGNOSIS — F411 Generalized anxiety disorder: Secondary | ICD-10-CM

## 2022-12-08 DIAGNOSIS — F3289 Other specified depressive episodes: Secondary | ICD-10-CM

## 2022-12-08 NOTE — Telephone Encounter (Signed)
Please advise [facility] 

## 2022-12-11 ENCOUNTER — Other Ambulatory Visit: Payer: Self-pay

## 2022-12-11 DIAGNOSIS — F3289 Other specified depressive episodes: Secondary | ICD-10-CM

## 2022-12-11 DIAGNOSIS — F411 Generalized anxiety disorder: Secondary | ICD-10-CM

## 2022-12-11 MED ORDER — SERTRALINE HCL 50 MG PO TABS: ORAL_TABLET | ORAL | 0 refills | Status: DC

## 2022-12-11 NOTE — Telephone Encounter (Signed)
Please advise [facility] 

## 2022-12-30 ENCOUNTER — Other Ambulatory Visit: Payer: Self-pay | Admitting: Family Medicine

## 2022-12-30 DIAGNOSIS — F411 Generalized anxiety disorder: Secondary | ICD-10-CM

## 2022-12-30 DIAGNOSIS — F3289 Other specified depressive episodes: Secondary | ICD-10-CM

## 2022-12-30 NOTE — Telephone Encounter (Signed)
Please advise [facility] 

## 2023-03-13 ENCOUNTER — Other Ambulatory Visit: Payer: Self-pay | Admitting: Family Medicine

## 2023-03-13 DIAGNOSIS — F411 Generalized anxiety disorder: Secondary | ICD-10-CM

## 2023-03-13 DIAGNOSIS — F3289 Other specified depressive episodes: Secondary | ICD-10-CM

## 2023-04-01 ENCOUNTER — Other Ambulatory Visit: Payer: Self-pay | Admitting: Family Medicine

## 2023-04-01 DIAGNOSIS — F411 Generalized anxiety disorder: Secondary | ICD-10-CM

## 2023-04-01 DIAGNOSIS — F3289 Other specified depressive episodes: Secondary | ICD-10-CM

## 2023-04-01 MED ORDER — SERTRALINE HCL 50 MG PO TABS: ORAL_TABLET | ORAL | 0 refills | Status: DC

## 2023-04-01 NOTE — Telephone Encounter (Signed)
Reviewed PDMP substance reporting system prior to prescribing opiate medications. No inconsistencies noted.  Meds ordered this encounter  Medications   sertraline (ZOLOFT) 50 MG tablet    Sig: TAKE 1 TABLET BY MOUTH EVERY NIGHT AT BEDTIME. PATIENT NEEDS AN APPOINTMENT.    Dispense:  10 tablet    Refill:  0    DX Code Needed  .    Order Specific Question:   Supervising Provider    Answer:   Quentin Angst [[id number]]   Nolon Nations  APRN, MSN, FNP-C Patient Care [facility] [address] [place], [place] [address] [phone]

## 2023-06-05 ENCOUNTER — Ambulatory Visit: Payer: Managed Care, Other (non HMO) | Admitting: Physician Assistant

## 2023-06-05 ENCOUNTER — Other Ambulatory Visit: Payer: Self-pay | Admitting: Physician Assistant

## 2023-06-05 VITALS — BP 158/96 | HR 98 | Temp 98.8°F | Ht 72.0 in | Wt >= 6400 oz

## 2023-06-05 DIAGNOSIS — R7303 Prediabetes: Secondary | ICD-10-CM | POA: Diagnosis not present

## 2023-06-05 DIAGNOSIS — F419 Anxiety disorder, unspecified: Secondary | ICD-10-CM | POA: Diagnosis not present

## 2023-06-05 DIAGNOSIS — R030 Elevated blood-pressure reading, without diagnosis of hypertension: Secondary | ICD-10-CM | POA: Diagnosis not present

## 2023-06-05 DIAGNOSIS — K219 Gastro-esophageal reflux disease without esophagitis: Secondary | ICD-10-CM | POA: Insufficient documentation

## 2023-06-05 DIAGNOSIS — F32A Depression, unspecified: Secondary | ICD-10-CM

## 2023-06-05 DIAGNOSIS — Z720 Tobacco use: Secondary | ICD-10-CM

## 2023-06-05 MED ORDER — SERTRALINE HCL 50 MG PO TABS: ORAL_TABLET | ORAL | 1 refills | Status: DC

## 2023-06-05 MED ORDER — SERTRALINE HCL 50 MG PO TABS: ORAL_TABLET | ORAL | 0 refills | Status: DC

## 2023-06-05 MED ORDER — OMEPRAZOLE 40 MG PO CPDR: 40.0000 mg | DELAYED_RELEASE_CAPSULE | Freq: Every day | ORAL | 1 refills | Status: DC

## 2023-06-05 NOTE — Assessment & Plan Note (Signed)
Encourage continued smoking cessation

## 2023-06-05 NOTE — Progress Notes (Signed)
 New patient visit   Patient: Antonio Hall   DOB: 1990/09/11   32 y.o. Male  MRN: [id number] Visit Date: 06/05/2023  Today's healthcare provider: Manuelita Flatness, PA-C   Cc. New patient, med refills  Subjective    Antonio Hall is a 33 y.o. male who presents today as a new patient to establish care.   Discussed the use of AI scribe software for clinical note transcription with the patient, who gave verbal consent to proceed.  History of Present Illness   The patient, with a history of prediabetes and acid reflux, presents for a primary care visit to restart his medications. He has been off sertraline , a medication for anxiety and depression, for approximately two months. The patient reports that he initially felt okay without the medication but is now starting to experience symptoms of anxiety and depression again. He expresses a desire to return to his previous dosage of 100mg  daily.  The patient also has a history of prediabetes and was previously on metformin . He reports no side effects from the medication. However, he has not been taking it recently. The patient denies any symptoms of high blood sugar such as frequent urination, excessive thirst, numbness or tingling in the feet, or blurred vision.  In addition, the patient has been experiencing acid reflux and is currently on omeprazole . He reports occasional bloating and a gurgling sensation as well as feeling food come up in his throat, but no heartburn. The symptoms seem to be more pronounced after eating and late at night. The patient has noticed that he gets full quicker than before.  The patient also mentions a history of smoking but has been trying to quit. He reports using a vape occasionally to manage work stress. He has been smoke-free for about a month but admits to occasionally borrowing a cigarette from a friend.       Past Medical History:  Diagnosis Date   Anxiety 2015   I think it linked to the depression or vice  versa   Depression 2015   Didn't get treatment until few years   Family history of diabetes mellitus in first degree relative    GERD (gastroesophageal reflux disease) 03/2023   Obesity    Past Surgical History:  Procedure Laterality Date   WISDOM TOOTH EXTRACTION     Family Status  Relation Name Status   Mother  Deceased   [employer] Alive   PGM  (Not Specified)  No partnership data on file   Family History  Problem Relation Age of Onset   Hypertension Mother    Cancer Maternal Grandmother        colon possibly   Diabetes Paternal Grandmother    Social History   Socioeconomic History   Marital status: Single    Spouse name: Not on file   Number of children: Not on file   Years of education: Not on file   Highest education level: GED or equivalent  Occupational History   Not on file  Tobacco Use   Smoking status: Some Days    Current packs/day: 1.00    Average packs/day: 1 pack/day for 3.1 years (3.1 ttl pk-yrs)    Types: Cigarettes, E-cigarettes    Start date: 2022   Smokeless tobacco: Never   Tobacco comments:    Tried to quit earlier. Started because of stress and inability to relax.  Vaping Use   Vaping status: Some Days   Start date: 04/22/2023  Substance and Sexual Activity  Alcohol use: Yes    Comment: I drink every other week, if even that.   Drug use: No   Sexual activity: Not Currently  Other Topics Concern   Not on file  Social History Narrative   Not on file   Social Drivers of Health   Financial Resource Strain: Low Risk  (06/05/2023)   Overall Financial Resource Strain (CARDIA)    Difficulty of Paying Living Expenses: Not very hard  Food Insecurity: No Food Insecurity (06/05/2023)   Hunger Vital Sign    Worried About Running Out of Food in the Last Year: Never true    Ran Out of Food in the Last Year: Never true  Transportation Needs: No Transportation Needs (06/05/2023)   PRAPARE - Administrator, Civil Service (Medical):  No    Lack of Transportation (Non-Medical): No  Physical Activity: Unknown (06/05/2023)   Exercise Vital Sign    Days of Exercise per Week: 0 days    Minutes of Exercise per Session: Not on file  Stress: Stress Concern Present (06/05/2023)   [employer] of Occupational Health - Occupational Stress Questionnaire    Feeling of Stress : Rather much  Social Connections: Socially Isolated (06/05/2023)   Social Connection and Isolation Panel [NHANES]    Frequency of Communication with Friends and Family: More than three times a week    Frequency of Social Gatherings with Friends and Family: Once a week    Attends Religious Services: Never    Database Administrator or Organizations: No    Attends Engineer, Structural: Not on file    Marital Status: Never married   Outpatient Medications Prior to Visit  Medication Sig   vitamin B-12 (CYANOCOBALAMIN) 100 MCG tablet Take 100 mcg by mouth daily. 1 tablet once a day   [DISCONTINUED] omeprazole  (PRILOSEC) 20 MG capsule Take 1 capsule (20 mg total) by mouth daily.   [DISCONTINUED] sertraline  (ZOLOFT ) 50 MG tablet TAKE 1 TABLET BY MOUTH EVERY NIGHT AT BEDTIME. PATIENT NEEDS AN APPOINTMENT. (Patient taking differently: Take 100 mg by mouth daily. TAKE 1 TABLET BY MOUTH EVERY NIGHT AT BEDTIME. PATIENT NEEDS AN APPOINTMENT.)   metFORMIN  (GLUCOPHAGE ) 500 MG tablet TAKE 1 TABLET BY MOUTH EVERY DAY WITH BREAKFAST (Patient not taking: Reported on 06/05/2023)   [DISCONTINUED] Cobalamin Combinations (NEURIVA PLUS PO) Take by mouth. 1 tablet once a day (Patient not taking: Reported on 12/06/2019)   No facility-administered medications prior to visit.   Allergies  Allergen Reactions   Shellfish Allergy     Immunization History  Administered Date(s) Administered   Dtap, Unspecified 08/28/1993, 11/04/1993, 12/24/1993, 08/15/1994   HIB, Unspecified 08/28/1993, 11/04/1993, 12/24/1993, 04/23/1994   Hep B, Unspecified Mar 04, 1993, 08/28/1993, 11/04/1993    MMR 04/23/1994   PFIZER(Purple Top)SARS-COV-2 Vaccination 06/24/2019, 07/15/2019   Polio, Unspecified 08/28/1993, 11/04/1993, 12/24/1993   Tdap 09/14/2013    Health Maintenance  Topic Date Due   Pneumococcal Vaccine 61-85 Years old (1 of 2 - PCV) Never done   COVID-19 Vaccine (3 - 2024-25 season) 12/28/2022   DTaP/Tdap/Td (6 - Td or Tdap) 09/15/2023   Hepatitis C Screening  Completed   HIV Screening  Completed   HPV VACCINES  Aged Out    Patient Care Team: Cyndi Shaver, PA-C as PCP - General (Physician Assistant)  Review of Systems  Constitutional:  Negative for fatigue and fever.  Respiratory:  Negative for cough and shortness of breath.   Cardiovascular:  Negative for chest pain, palpitations and leg swelling.  Gastrointestinal:        Reflux  Neurological:  Negative for dizziness and headaches.  Psychiatric/Behavioral:  The patient is nervous/anxious.         Objective    BP (!) 158/96   Pulse 98   Temp 98.8 F (37.1 C) (Oral)   Ht 6' (1.829 m)   Wt (!) 422 lb 6 oz (191.6 kg)   SpO2 94%   BMI 57.28 kg/m     Physical Exam Constitutional:      General: He is awake.     Appearance: He is well-developed. He is obese.  HENT:     Head: Normocephalic.  Eyes:     Conjunctiva/sclera: Conjunctivae normal.  Cardiovascular:     Rate and Rhythm: Normal rate and regular rhythm.     Heart sounds: Normal heart sounds.  Pulmonary:     Effort: Pulmonary effort is normal.     Breath sounds: Normal breath sounds.  Skin:    General: Skin is warm.  Neurological:     Mental Status: He is alert and oriented to person, place, and time.  Psychiatric:        Attention and Perception: Attention normal.        Mood and Affect: Mood normal.        Speech: Speech normal.        Behavior: Behavior is cooperative.    Depression Screen    06/05/2023    2:15 PM 12/06/2019    8:45 AM 09/27/2019   10:57 AM 09/27/2019   10:38 AM  PHQ 2/9 Scores  PHQ - 2 Score 3 1 4 3   PHQ- 9  Score 15  9 14    No results found for any visits on 06/05/23.  Assessment & Plan     Gastroesophageal reflux disease, unspecified whether esophagitis present Assessment & Plan: Recommend increasing omeprazole  to 40 mg daily  Orders: -     CBC with Differential/Platelet -     Omeprazole ; Take 1 capsule (40 mg total) by mouth daily.  Dispense: 90 capsule; Refill: 1  Anxiety and depression Assessment & Plan: Restarting sertraline -- 50 mg daily for a week then ok to increase back to maintenance dose of 100 mg  F/u 4-6 weeks  Orders: -     Sertraline  HCl; Take 50 mg daily for a week and then increase to 100 mg daily.  Dispense: 90 tablet; Refill: 1  Prediabetes Assessment & Plan: Hold off on re-starting metformin , ordered A1c.  Will tx pending results  Orders: -     CBC with Differential/Platelet -     Comprehensive metabolic panel -     Hemoglobin A1c  Elevated blood pressure reading in office without diagnosis of hypertension Assessment & Plan: Elevated in office, initial and repeat.  Advise pt to monitor at home Reviewed diet and exercise changes to help BP F/u 4-6 weeks   Morbid obesity (HCC) Assessment & Plan: He is working on [employer] management through dietary changes and increased physical activity. Discussed potential weight loss medications, including injectable options . Pt will consider  Orders: -     CBC with Differential/Platelet -     Comprehensive metabolic panel -     Lipid panel  Tobacco use Assessment & Plan: - Encourage continued smoking cessation    General Health Maintenance -declined flu vaccine - Encourage regular physical activity and balanced diet   Return in about 4 weeks (around 07/03/2023) for anxiety, depression, hypertension.      Manuelita Flatness,  PA-C  [facility] at [facility] [phone] (phone) [phone] (fax)  [facility]

## 2023-06-05 NOTE — Assessment & Plan Note (Signed)
 He is working on [employer] through dietary changes and increased physical activity. Discussed potential weight loss medications, including injectable options . Pt will consider

## 2023-06-05 NOTE — Assessment & Plan Note (Signed)
 Hold off on re-starting metformin , ordered A1c.  Will tx pending results

## 2023-06-05 NOTE — Assessment & Plan Note (Signed)
Recommend increasing omeprazole to 40 mg daily.

## 2023-06-05 NOTE — Patient Instructions (Signed)
 Wegovy   Zepbound   -weight loss medicines

## 2023-06-05 NOTE — Assessment & Plan Note (Signed)
 Restarting sertraline -- 50 mg daily for a week then ok to increase back to maintenance dose of 100 mg  F/u 4-6 weeks

## 2023-06-05 NOTE — Assessment & Plan Note (Addendum)
 Elevated in office, initial and repeat.  Advise pt to monitor at home Reviewed diet and exercise changes to help BP F/u 4-6 weeks

## 2023-06-06 LAB — COMPREHENSIVE METABOLIC PANEL
ALT: 17 [IU]/L (ref 0–44)
AST: 21 [IU]/L (ref 0–40)
Albumin: 4 g/dL — ABNORMAL LOW (ref 4.3–5.2)
Alkaline Phosphatase: 78 [IU]/L (ref 44–121)
BUN/Creatinine Ratio: 14 (ref 9–20)
BUN: 11 mg/dL (ref 6–20)
Bilirubin Total: 0.2 mg/dL (ref 0.0–1.2)
CO2: 25 mmol/L (ref 20–29)
Calcium: 9.3 mg/dL (ref 8.7–10.2)
Chloride: 97 mmol/L (ref 96–106)
Creatinine, Ser: 0.81 mg/dL (ref 0.76–1.27)
Globulin, Total: 3.3 g/dL (ref 1.5–4.5)
Glucose: 112 mg/dL — ABNORMAL HIGH (ref 70–99)
Potassium: 4.2 mmol/L (ref 3.5–5.2)
Sodium: 138 mmol/L (ref 134–144)
Total Protein: 7.3 g/dL (ref 6.0–8.5)
eGFR: 122 mL/min/{1.73_m2} (ref >59)

## 2023-06-06 LAB — CBC WITH DIFFERENTIAL/PLATELET
Basophils Absolute: 0 10*3/uL (ref 0.0–0.2)
Basos: 1 %
EOS (ABSOLUTE): 0.2 10*3/uL (ref 0.0–0.4)
Eos: 3 %
Hematocrit: 43.9 % (ref 37.5–51.0)
Hemoglobin: 14.2 g/dL (ref 13.0–17.7)
Immature Grans (Abs): 0 10*3/uL (ref 0.0–0.1)
Immature Granulocytes: 0 %
Lymphocytes Absolute: 2.2 10*3/uL (ref 0.7–3.1)
Lymphs: 35 %
MCH: 24.5 pg — ABNORMAL LOW (ref 26.6–33.0)
MCHC: 32.3 g/dL (ref 31.5–35.7)
MCV: 76 fL — ABNORMAL LOW (ref 79–97)
Monocytes Absolute: 0.5 10*3/uL (ref 0.1–0.9)
Monocytes: 9 %
Neutrophils Absolute: 3.3 10*3/uL (ref 1.4–7.0)
Neutrophils: 52 %
Platelets: 336 10*3/uL (ref 150–450)
RBC: 5.8 x10E6/uL (ref 4.14–5.80)
RDW: 13.9 % (ref 11.6–15.4)
WBC: 6.2 10*3/uL (ref 3.4–10.8)

## 2023-06-06 LAB — LIPID PANEL
Chol/HDL Ratio: 4.8 {ratio} (ref 0.0–5.0)
Cholesterol, Total: 209 mg/dL — ABNORMAL HIGH (ref 100–199)
HDL: 44 mg/dL (ref >39)
LDL Chol Calc (NIH): 139 mg/dL — ABNORMAL HIGH (ref 0–99)
Triglycerides: 144 mg/dL (ref 0–149)
VLDL Cholesterol Cal: 26 mg/dL (ref 5–40)

## 2023-06-06 LAB — HEMOGLOBIN A1C
Est. average glucose Bld gHb Est-mCnc: 157 mg/dL
Hgb A1c MFr Bld: 7.1 % — ABNORMAL HIGH (ref 4.8–5.6)

## 2023-06-08 ENCOUNTER — Encounter: Payer: Self-pay | Admitting: Physician Assistant

## 2023-06-08 DIAGNOSIS — E1165 Type 2 diabetes mellitus with hyperglycemia: Secondary | ICD-10-CM | POA: Insufficient documentation

## 2023-08-23 ENCOUNTER — Other Ambulatory Visit: Payer: Self-pay | Admitting: Physician Assistant

## 2023-08-23 DIAGNOSIS — F32A Depression, unspecified: Secondary | ICD-10-CM

## 2023-08-24 ENCOUNTER — Encounter: Payer: Self-pay | Admitting: *Deleted

## 2023-08-24 ENCOUNTER — Other Ambulatory Visit: Payer: Self-pay | Admitting: Physician Assistant

## 2023-08-24 DIAGNOSIS — F32A Depression, unspecified: Secondary | ICD-10-CM

## 2023-09-15 ENCOUNTER — Encounter: Payer: Self-pay | Admitting: *Deleted

## 2023-10-23 ENCOUNTER — Other Ambulatory Visit: Payer: Self-pay | Admitting: Family Medicine

## 2023-10-23 DIAGNOSIS — F32A Anxiety disorder, unspecified: Secondary | ICD-10-CM

## 2023-11-24 ENCOUNTER — Encounter: Admitting: Physician Assistant

## 2023-12-10 ENCOUNTER — Ambulatory Visit: Admitting: Physician Assistant

## 2023-12-18 ENCOUNTER — Encounter: Payer: Self-pay | Admitting: Physician Assistant

## 2023-12-18 ENCOUNTER — Other Ambulatory Visit (HOSPITAL_BASED_OUTPATIENT_CLINIC_OR_DEPARTMENT_OTHER): Payer: Self-pay

## 2023-12-18 ENCOUNTER — Ambulatory Visit: Admitting: Physician Assistant

## 2023-12-18 VITALS — BP 143/81 | HR 82 | Ht 72.0 in | Wt >= 6400 oz

## 2023-12-18 DIAGNOSIS — F32A Depression, unspecified: Secondary | ICD-10-CM | POA: Diagnosis not present

## 2023-12-18 DIAGNOSIS — F419 Anxiety disorder, unspecified: Secondary | ICD-10-CM

## 2023-12-18 DIAGNOSIS — E1165 Type 2 diabetes mellitus with hyperglycemia: Secondary | ICD-10-CM | POA: Diagnosis not present

## 2023-12-18 DIAGNOSIS — R030 Elevated blood-pressure reading, without diagnosis of hypertension: Secondary | ICD-10-CM

## 2023-12-18 MED ORDER — SERTRALINE HCL 50 MG PO TABS
50.0000 mg | ORAL_TABLET | Freq: Every day | ORAL | 0 refills | Status: DC
  Filled 2023-12-18: qty 60, 32d supply, fill #0

## 2023-12-18 NOTE — Assessment & Plan Note (Addendum)
 Again, recommending starting at 50 mg x 1 week and then increasing to 100 mg . Pt prefers 30 day rx at a time d/t cost . F/b 6-8 weeks

## 2023-12-18 NOTE — Progress Notes (Signed)
 Established patient visit   Patient: Antonio Hall   DOB: 26-Mar-1991   33 y.o. Male  MRN: [id number] Visit Date: 12/18/2023  Today's healthcare provider: Manuelita Flatness, PA-C   Cc. HTN f/u new dx DM  Subjective     Hypertension, follow-up  BP Readings from Last 3 Encounters:  12/18/23 (!) 143/81  06/05/23 (!) 158/96  06/05/20 129/67   Wt Readings from Last 3 Encounters:  12/18/23 (!) 403 lb 9.6 oz (183.1 kg)  06/05/23 (!) 422 lb 6 oz (191.6 kg)  06/05/20 (!) 382 lb (173.3 kg)     Elevated with no prior dx of HTN. Pt was advised to check at home and f/b in 4-6 weeks back in 2/25.   Outside blood pressures are not being checked.  Pertinent labs Lab Results  Component Value Date   CHOL 209 (H) 06/05/2023   HDL 44 06/05/2023   LDLCALC 139 (H) 06/05/2023   TRIG 144 06/05/2023   CHOLHDL 4.8 06/05/2023   Lab Results  Component Value Date   NA 138 06/05/2023   K 4.2 06/05/2023   CREATININE 0.81 06/05/2023   EGFR 122 06/05/2023   GLUCOSE 112 (H) 06/05/2023   TSH 1.960 09/27/2019     The ASCVD Risk score (Arnett DK, et al., 2019) failed to calculate for the following reasons:   The 2019 ASCVD risk score is only valid for ages 28 to 64  --------------------------------------------------------------------------------------------------- Diabetes Mellitus Type II, Follow-up  Lab Results  Component Value Date   HGBA1C 7.1 (H) 06/05/2023   HGBA1C 6.4 (H) 03/06/2020   HGBA1C 5.9 (A) 09/27/2019   HGBA1C 5.9 09/27/2019   HGBA1C 5.9 09/27/2019   HGBA1C 5.9 09/27/2019   Wt Readings from Last 3 Encounters:  12/18/23 (!) 403 lb 9.6 oz (183.1 kg)  06/05/23 (!) 422 lb 6 oz (191.6 kg)  06/05/20 (!) 382 lb (173.3 kg)   Found on labs 2/25. Pt was recommend to f/b in office to discuss options at that time. This is his first visit since then.   Pertinent Labs: Lab Results  Component Value Date   CHOL 209 (H) 06/05/2023   HDL 44 06/05/2023   LDLCALC 139 (H)  06/05/2023   TRIG 144 06/05/2023   CHOLHDL 4.8 06/05/2023   Lab Results  Component Value Date   NA 138 06/05/2023   K 4.2 06/05/2023   CREATININE 0.81 06/05/2023   EGFR 122 06/05/2023     --------------------------------------------------------------------------------------------------- Pt reports he has run out of his sertraline .   Medications: Outpatient Medications Prior to Visit  Medication Sig   metFORMIN  (GLUCOPHAGE ) 500 MG tablet TAKE 1 TABLET BY MOUTH EVERY DAY WITH BREAKFAST (Patient not taking: Reported on 06/05/2023)   omeprazole  (PRILOSEC) 40 MG capsule Take 1 capsule (40 mg total) by mouth daily. (Patient not taking: Reported on 12/18/2023)   vitamin B-12 (CYANOCOBALAMIN) 100 MCG tablet Take 100 mcg by mouth daily. 1 tablet once a day   [DISCONTINUED] sertraline  (ZOLOFT ) 50 MG tablet TAKE 1 TABLET BY MOUTH EVERY NIGHT AT BEDTIME. PATIENT NEEDS AN APPOINTMENT.   No facility-administered medications prior to visit.    Review of Systems  Constitutional:  Negative for fatigue and fever.  Respiratory:  Negative for cough and shortness of breath.   Cardiovascular:  Negative for chest pain, palpitations and leg swelling.  Neurological:  Negative for dizziness and headaches.       Objective    BP (!) 143/81   Pulse 82   Ht 6' (  1.829 m)   Wt (!) 403 lb 9.6 oz (183.1 kg)   BMI 54.74 kg/m    Physical Exam Vitals reviewed.  Constitutional:      Appearance: He is not ill-appearing.  HENT:     Head: Normocephalic.  Eyes:     Conjunctiva/sclera: Conjunctivae normal.  Cardiovascular:     Rate and Rhythm: Normal rate.  Pulmonary:     Effort: Pulmonary effort is normal. No respiratory distress.  Neurological:     Mental Status: He is alert and oriented to person, place, and time.  Psychiatric:        Mood and Affect: Mood normal.        Behavior: Behavior normal.      No results found for any visits on 12/18/23.  Assessment & Plan    Type 2 diabetes mellitus  with hyperglycemia, without long-term current use of insulin (HCC) Assessment & Plan: Pt has lost 20 lbs w/ lifestyle changes Repeat A1c, ordering uacr  If A1c < 7%, continue with lifestyle changes, does not have to take metformin .  F/b 3-4 mo  Orders: -     Hemoglobin A1c -     Comprehensive metabolic panel with GFR -     Microalbumin / creatinine urine ratio  Anxiety and depression Assessment & Plan: Again, recommending starting at 50 mg x 1 week and then increasing to 100 mg . Pt prefers 30 day rx at a time d/t cost . F/b 6-8 weeks  Orders: -     Sertraline  HCl; Take 1 tablet (50 mg total) by mouth daily. For a week and then increase to 100 mg ( 2 tablets)  Dispense: 60 tablet; Refill: 0  Elevated blood pressure reading in office without diagnosis of hypertension Assessment & Plan: Improved today with weight loss  Will continue to monitor  Encouraged diet/exercise/lifestyle changes      Return in about 6 weeks (around 01/29/2024) for anxiety, depression.       Manuelita Flatness, PA-C  [facility] at [facility] [phone] (phone) [phone] (fax)  [facility]

## 2023-12-18 NOTE — Assessment & Plan Note (Signed)
 Pt has lost 20 lbs w/ lifestyle changes Repeat A1c, ordering uacr  If A1c < 7%, continue with lifestyle changes, does not have to take metformin .  F/b 3-4 mo

## 2023-12-18 NOTE — Assessment & Plan Note (Addendum)
 Improved today with weight loss  Will continue to monitor  Encouraged diet/exercise/lifestyle changes

## 2023-12-19 LAB — COMPREHENSIVE METABOLIC PANEL WITH GFR
ALT: 21 IU/L (ref 0–44)
AST: 20 IU/L (ref 0–40)
Albumin: 4.1 g/dL — ABNORMAL LOW (ref 4.3–5.2)
Alkaline Phosphatase: 76 IU/L (ref 44–121)
BUN/Creatinine Ratio: 13 (ref 9–20)
BUN: 10 mg/dL (ref 6–20)
Bilirubin Total: 0.4 mg/dL (ref 0.0–1.2)
CO2: 25 mmol/L (ref 20–29)
Calcium: 9.1 mg/dL (ref 8.7–10.2)
Chloride: 97 mmol/L (ref 96–106)
Creatinine, Ser: 0.8 mg/dL (ref 0.76–1.27)
Globulin, Total: 3.5 g/dL (ref 1.5–4.5)
Glucose: 84 mg/dL (ref 70–99)
Potassium: 4.6 mmol/L (ref 3.5–5.2)
Sodium: 137 mmol/L (ref 134–144)
Total Protein: 7.6 g/dL (ref 6.0–8.5)
eGFR: 122 mL/min/1.73 (ref >59)

## 2023-12-19 LAB — HEMOGLOBIN A1C
Est. average glucose Bld gHb Est-mCnc: 146 mg/dL
Hgb A1c MFr Bld: 6.7 % — ABNORMAL HIGH (ref 4.8–5.6)

## 2023-12-19 LAB — MICROALBUMIN / CREATININE URINE RATIO
Creatinine, Urine: 229.2 mg/dL
Microalb/Creat Ratio: 2 mg/g{creat} (ref 0–29)
Microalbumin, Urine: 5.6 ug/mL

## 2023-12-21 ENCOUNTER — Ambulatory Visit: Payer: Self-pay | Admitting: Physician Assistant

## 2024-02-01 ENCOUNTER — Other Ambulatory Visit: Payer: Self-pay

## 2024-02-09 ENCOUNTER — Telehealth: Payer: Self-pay | Admitting: Physician Assistant

## 2024-02-09 DIAGNOSIS — K219 Gastro-esophageal reflux disease without esophagitis: Secondary | ICD-10-CM

## 2024-02-09 DIAGNOSIS — F32A Depression, unspecified: Secondary | ICD-10-CM

## 2024-02-09 NOTE — Telephone Encounter (Signed)
 Copied from CRM [id number]. Topic: Appointments - Scheduling Inquiry for Clinic >> Feb 09, 2024  3:13 PM Terri G wrote: Reason for CRM: Patient is wanting to see a new doctor since Dr.Drubel has left the practice but he needs something asap for his medication refill. Did schedule an appointment for the earliest with Dr.Beck in Jan and added him on the waitlist but he wanted to know if she could refill his medication before his appointment. Callback number [phone]

## 2024-02-10 MED ORDER — OMEPRAZOLE 40 MG PO CPDR: 40.0000 mg | DELAYED_RELEASE_CAPSULE | Freq: Every day | ORAL | 0 refills | Status: AC

## 2024-02-10 MED ORDER — SERTRALINE HCL 50 MG PO TABS: 100.0000 mg | ORAL_TABLET | Freq: Every day | ORAL | 0 refills | Status: DC

## 2024-02-10 NOTE — Telephone Encounter (Signed)
 Called LVM letting him know we sent refills of the two medications Manuelita wrote in the past since no clarification on which medication was needed. To call back with any problems or questions.

## 2024-05-06 ENCOUNTER — Other Ambulatory Visit: Payer: Self-pay | Admitting: Neurology

## 2024-05-06 ENCOUNTER — Other Ambulatory Visit: Payer: Self-pay | Admitting: Family Medicine

## 2024-05-06 DIAGNOSIS — F419 Anxiety disorder, unspecified: Secondary | ICD-10-CM

## 2024-05-06 MED ORDER — SERTRALINE HCL 50 MG PO TABS: 100.0000 mg | ORAL_TABLET | Freq: Every day | ORAL | 0 refills | Status: AC

## 2024-05-09 NOTE — Progress Notes (Incomplete)
 "   New Patient Office Visit   Subjective     Patient ID: Antonio Hall, male   DOB: [birth year]  Age: 34 y.o. MRN: [id number]   CC:  No chief complaint on file.     HPI Dior Stepter presents to establish care    Diabetes: - Checking glucose at home: *** - Medications: none (previously on Metformin  500 mg daily)  - Compliance: n/a - Diet: *** - Exercise: *** - Eye exam: due - Foot exam: *** - Microalbumin: 12/18/2023 - Denies symptoms of hypoglycemia, polyuria, polydipsia, numbness extremities, foot ulcers/trauma, wounds that are not healing, medication side effects    Anxiety/Depression: - Treatment: Sertraline  50 mg daily.  - Medication side effects:  - SI/HI:  - Update:     12/18/2023    2:00 PM 06/05/2023    2:15 PM 12/06/2019    8:45 AM 09/27/2019   10:57 AM 09/27/2019   10:38 AM  Depression screen PHQ 2/9  Decreased Interest 2 2 1 2 1   Down, Depressed, Hopeless 0 1 0 2 2  PHQ - 2 Score 2 3 1 4 3   Altered sleeping 2 3  0 0  Tired, decreased energy 1 3  2 1   Change in appetite 0 2  0 3  Feeling bad or failure about yourself  0 1  1 2   Trouble concentrating 1 2  0 3  Moving slowly or fidgety/restless 1 1  2 2   Suicidal thoughts 0 0  0 0  PHQ-9 Score 7  15   9  14    Difficult doing work/chores Somewhat difficult Very difficult  Not difficult at all      Data saved with a previous flowsheet row definition      12/18/2023    2:00 PM 06/05/2023    2:15 PM 03/06/2020   10:10 AM 11/01/2019    9:32 AM  GAD 7 : Generalized Anxiety Score  Nervous, Anxious, on Edge 2 1 1 1   Control/stop worrying 0 0 0 1  Worry too much - different things 0 0 0 1  Trouble relaxing 3 3 0 0  Restless 0 0 0 0  Easily annoyed or irritable 1 3 1  0  Afraid - awful might happen 0 1 0 0  Total GAD 7 Score 6 8 2 3   Anxiety Difficulty Somewhat difficult Very difficult Somewhat difficult Not difficult at all     GERD: - management: Omeprazole  40 mg daily.  - symptoms:     Show/hide  medication list[1] Past Medical History:  Diagnosis Date   Anxiety 2015   I think it linked to the depression or vice versa   Depression 2015   Didnt get treatment until few years   Family history of diabetes mellitus in first degree relative    GERD (gastroesophageal reflux disease) 03/2023   Obesity     Past Surgical History:  Procedure Laterality Date   WISDOM TOOTH EXTRACTION       Family History  Problem Relation Age of Onset   Hypertension Mother    Cancer Maternal Grandmother        colon possibly   Diabetes Paternal Grandmother     Social History   Socioeconomic History   Marital status: Single    Spouse name: Not on file   Number of children: Not on file   Years of education: Not on file   Highest education level: GED or equivalent  Occupational History   Not on file  Tobacco Use   Smoking status: Some Days    Current packs/day: 1.00    Average packs/day: 1 pack/day for 4.0 years (4.0 ttl pk-yrs)    Types: Cigarettes, E-cigarettes    Start date: 2022   Smokeless tobacco: Never   Tobacco comments:    Tried to quit earlier. Started because of stress and inability to relax.  Vaping Use   Vaping status: Some Days   Start date: 04/22/2023  Substance and Sexual Activity   Alcohol use: Yes    Comment: I drink every other week, if even that.   Drug use: No   Sexual activity: Not Currently  Other Topics Concern   Not on file  Social History Narrative   Not on file   Social Drivers of Health   Tobacco Use: High Risk (12/18/2023)   Patient History    Smoking Tobacco Use: Some Days    Smokeless Tobacco Use: Never    Passive Exposure: Not on file  Financial Resource Strain: Low Risk (06/05/2023)   Overall Financial Resource Strain (CARDIA)    Difficulty of Paying Living Expenses: Not very hard  Food Insecurity: No Food Insecurity (06/05/2023)   Hunger Vital Sign    Worried About Running Out of Food in the Last Year: Never true    Ran Out of Food in the  Last Year: Never true  Transportation Needs: No Transportation Needs (06/05/2023)   PRAPARE - Administrator, Civil Service (Medical): No    Lack of Transportation (Non-Medical): No  Physical Activity: Unknown (06/05/2023)   Exercise Vital Sign    Days of Exercise per Week: 0 days    Minutes of Exercise per Session: Not on file  Stress: Stress Concern Present (06/05/2023)   [employer] of Occupational Health - Occupational Stress Questionnaire    Feeling of Stress : Rather much  Social Connections: Socially Isolated (06/05/2023)   Social Connection and Isolation Panel    Frequency of Communication with Friends and Family: More than three times a week    Frequency of Social Gatherings with Friends and Family: Once a week    Attends Religious Services: Never    Database Administrator or Organizations: No    Attends Engineer, Structural: Not on file    Marital Status: Never married  Depression (PHQ2-9): Medium Risk (12/18/2023)   Depression (PHQ2-9)    PHQ-2 Score: 7  Alcohol Screen: Low Risk (06/05/2023)   Alcohol Screen    Last Alcohol Screening Score (AUDIT): 2  Housing: Low Risk (06/05/2023)   Housing Stability Vital Sign    Unable to Pay for Housing in the Last Year: No    Number of Times Moved in the Last Year: 0    Homeless in the Last Year: No  Utilities: Not on file  Health Literacy: Not on file       ROS All review of systems negative except what is listed in the HPI    Objective     There were no vitals taken for this visit.  Physical Exam     Assessment & Plan:     Problem List Items Addressed This Visit   None            No follow-ups on file.  Waddell KATHEE Mon, NP  I,Emily Lagle,acting as a scribe for Waddell KATHEE Mon, NP.,have documented all relevant documentation on the behalf of Waddell KATHEE Mon, NP.  I, Waddell KATHEE Mon, NP, have reviewed all documentation for  this visit. The documentation on 05/12/2024 for the exam, diagnosis, procedures,  and orders are all accurate and complete.    [1]  Outpatient Medications Prior to Visit  Medication Sig   metFORMIN  (GLUCOPHAGE ) 500 MG tablet TAKE 1 TABLET BY MOUTH EVERY DAY WITH BREAKFAST (Patient not taking: Reported on 06/05/2023)   omeprazole  (PRILOSEC) 40 MG capsule Take 1 capsule (40 mg total) by mouth daily.   sertraline  (ZOLOFT ) 50 MG tablet Take 2 tablets (100 mg total) by mouth daily.   vitamin B-12 (CYANOCOBALAMIN) 100 MCG tablet Take 100 mcg by mouth daily. 1 tablet once a day   No facility-administered medications prior to visit.   "

## 2024-05-12 ENCOUNTER — Encounter: Admitting: Family Medicine

## 2024-05-12 DIAGNOSIS — K219 Gastro-esophageal reflux disease without esophagitis: Secondary | ICD-10-CM

## 2024-05-12 DIAGNOSIS — F32A Depression, unspecified: Secondary | ICD-10-CM

## 2024-05-12 DIAGNOSIS — E1165 Type 2 diabetes mellitus with hyperglycemia: Secondary | ICD-10-CM

## 2024-05-12 DIAGNOSIS — Z0000 Encounter for general adult medical examination without abnormal findings: Secondary | ICD-10-CM
